# Patient Record
Sex: Female | Born: 1955 | Race: White | Hispanic: No | Smoking: Never smoker
Health system: Southern US, Community
[De-identification: ages and names within clinical notes are randomized; demographics above are authoritative.]

## PROBLEM LIST (undated history)

## (undated) DIAGNOSIS — E079 Disorder of thyroid, unspecified: Secondary | ICD-10-CM

## (undated) DIAGNOSIS — F419 Anxiety disorder, unspecified: Secondary | ICD-10-CM

## (undated) DIAGNOSIS — F329 Major depressive disorder, single episode, unspecified: Secondary | ICD-10-CM

## (undated) DIAGNOSIS — F32A Depression, unspecified: Secondary | ICD-10-CM

## (undated) DIAGNOSIS — T7840XA Allergy, unspecified, initial encounter: Secondary | ICD-10-CM

## (undated) HISTORY — PX: APPENDECTOMY: SHX54

## (undated) HISTORY — DX: Depression, unspecified: F32.A

## (undated) HISTORY — DX: Disorder of thyroid, unspecified: E07.9

## (undated) HISTORY — PX: CHOLECYSTECTOMY: SHX55

## (undated) HISTORY — DX: Allergy, unspecified, initial encounter: T78.40XA

## (undated) HISTORY — DX: Major depressive disorder, single episode, unspecified: F32.9

## (undated) HISTORY — PX: ABDOMINAL HYSTERECTOMY: SHX81

## (undated) HISTORY — DX: Anxiety disorder, unspecified: F41.9

---

## 2007-01-17 ENCOUNTER — Encounter: Admission: RE | Admit: 2007-01-17 | Discharge: 2007-01-17 | Payer: Self-pay | Admitting: Internal Medicine

## 2008-02-08 ENCOUNTER — Ambulatory Visit: Payer: Self-pay | Admitting: Gastroenterology

## 2008-02-22 ENCOUNTER — Ambulatory Visit: Payer: Self-pay | Admitting: Gastroenterology

## 2008-06-27 ENCOUNTER — Encounter: Admission: RE | Admit: 2008-06-27 | Discharge: 2008-06-27 | Payer: Self-pay | Admitting: Internal Medicine

## 2009-12-23 ENCOUNTER — Encounter: Admission: RE | Admit: 2009-12-23 | Discharge: 2009-12-23 | Payer: Self-pay | Admitting: Internal Medicine

## 2011-10-08 ENCOUNTER — Ambulatory Visit (INDEPENDENT_AMBULATORY_CARE_PROVIDER_SITE_OTHER): Payer: BC Managed Care – PPO

## 2011-10-08 DIAGNOSIS — R51 Headache: Secondary | ICD-10-CM

## 2011-10-08 DIAGNOSIS — F411 Generalized anxiety disorder: Secondary | ICD-10-CM

## 2011-10-08 DIAGNOSIS — E669 Obesity, unspecified: Secondary | ICD-10-CM

## 2011-10-08 DIAGNOSIS — R1013 Epigastric pain: Secondary | ICD-10-CM

## 2011-10-08 DIAGNOSIS — K3189 Other diseases of stomach and duodenum: Secondary | ICD-10-CM

## 2011-10-14 ENCOUNTER — Other Ambulatory Visit: Payer: Self-pay | Admitting: Physician Assistant

## 2011-10-14 DIAGNOSIS — Z1231 Encounter for screening mammogram for malignant neoplasm of breast: Secondary | ICD-10-CM

## 2011-10-26 ENCOUNTER — Ambulatory Visit
Admission: RE | Admit: 2011-10-26 | Discharge: 2011-10-26 | Disposition: A | Discharge status: Home / Self Care | Payer: Self-pay | Source: Ambulatory Visit | Attending: Physician Assistant | Admitting: Physician Assistant

## 2011-10-26 DIAGNOSIS — Z1231 Encounter for screening mammogram for malignant neoplasm of breast: Secondary | ICD-10-CM

## 2011-11-10 ENCOUNTER — Other Ambulatory Visit: Payer: Self-pay | Admitting: Physician Assistant

## 2011-11-10 MED ORDER — ESTROGENS CONJUGATED 0.3 MG PO TABS: ORAL_TABLET | ORAL | Status: DC

## 2011-12-29 ENCOUNTER — Other Ambulatory Visit: Payer: Self-pay | Admitting: Physician Assistant

## 2012-01-28 ENCOUNTER — Ambulatory Visit (INDEPENDENT_AMBULATORY_CARE_PROVIDER_SITE_OTHER): Payer: BC Managed Care – PPO | Admitting: Physician Assistant

## 2012-01-28 VITALS — BP 116/74 | HR 74 | Temp 98.3°F | Resp 16 | Ht 61.25 in | Wt 183.2 lb

## 2012-01-28 DIAGNOSIS — E034 Atrophy of thyroid (acquired): Secondary | ICD-10-CM | POA: Insufficient documentation

## 2012-01-28 DIAGNOSIS — F411 Generalized anxiety disorder: Secondary | ICD-10-CM

## 2012-01-28 DIAGNOSIS — R5381 Other malaise: Secondary | ICD-10-CM

## 2012-01-28 DIAGNOSIS — E039 Hypothyroidism, unspecified: Secondary | ICD-10-CM

## 2012-01-28 DIAGNOSIS — R7989 Other specified abnormal findings of blood chemistry: Secondary | ICD-10-CM

## 2012-01-28 LAB — POCT CBC
MCV: 82.7 fL (ref 80–97)
MPV: 8.9 fL (ref 0–99.8)
POC Granulocyte: 5.8 (ref 2–6.9)
POC LYMPH PERCENT: 27.2 %L (ref 10–50)
POC MID %: 5.1 %M (ref 0–12)
RBC: 5.08 M/uL (ref 4.04–5.48)
WBC: 8.5 10*3/uL (ref 4.6–10.2)

## 2012-01-28 MED ORDER — LIOTHYRONINE SODIUM 5 MCG PO TABS: 5.0000 ug | ORAL_TABLET | Freq: Every day | ORAL | Status: DC

## 2012-01-28 MED ORDER — BUPROPION HCL ER (SR) 150 MG PO TB12: 150.0000 mg | ORAL_TABLET | Freq: Two times a day (BID) | ORAL | Status: DC

## 2012-01-28 MED ORDER — LEVOTHYROXINE SODIUM 125 MCG PO TABS: 125.0000 ug | ORAL_TABLET | Freq: Every day | ORAL | Status: DC

## 2012-01-28 NOTE — Progress Notes (Signed)
  Subjective:    Patient ID: Lacey Wilson, female    DOB: Feb 11, 1956, 56 y.o.   MRN: [id number]  HPI Doing well.  Here bc feeling fatigued  And arms feeling "heavy"; that's how she felt when she was initially diagnosed as hypothyroid.   Also, needs to have LFTs rechecked bc were slightly elevated in January. Anxiety is well controlled on the Wellbutrin.  She is feeling motivated to exercise and has lost 17 pounds since January.  Review of Systems  All other systems reviewed and are negative.       Objective:   Physical Exam  Constitutional: She is oriented to person, place, and time. She appears well-developed and well-nourished.  HENT:  Head: Normocephalic and atraumatic.  Neck: No thyromegaly present.  Cardiovascular: Normal rate, regular rhythm and normal heart sounds.   Pulmonary/Chest: Effort normal and breath sounds normal.  Neurological: She is alert and oriented to person, place, and time.  Skin: Skin is warm and dry.      Assessment & Plan:  Hypothyroid-experiencing fatigue-check TSH Anxiety and obesity-both improving on wellbutrin H/o elevated LFTs in January 2013 during viral illness.

## 2012-01-29 LAB — HEPATIC FUNCTION PANEL
Alkaline Phosphatase: 57 U/L (ref 39–117)
Bilirubin, Direct: 0.1 mg/dL (ref 0.0–0.3)
Total Bilirubin: 0.3 mg/dL (ref 0.3–1.2)
Total Protein: 6.6 g/dL (ref 6.0–8.3)

## 2012-01-29 LAB — TSH: TSH: 2.315 u[IU]/mL (ref 0.350–4.500)

## 2012-03-28 DIAGNOSIS — Z0271 Encounter for disability determination: Secondary | ICD-10-CM

## 2012-04-20 ENCOUNTER — Ambulatory Visit (INDEPENDENT_AMBULATORY_CARE_PROVIDER_SITE_OTHER): Payer: BC Managed Care – PPO | Admitting: Physician Assistant

## 2012-04-20 ENCOUNTER — Encounter: Payer: Self-pay | Admitting: Physician Assistant

## 2012-04-20 VITALS — BP 122/80 | HR 80 | Temp 97.9°F | Resp 16 | Ht 61.25 in | Wt 166.6 lb

## 2012-04-20 DIAGNOSIS — F32A Depression, unspecified: Secondary | ICD-10-CM

## 2012-04-20 DIAGNOSIS — E669 Obesity, unspecified: Secondary | ICD-10-CM

## 2012-04-20 DIAGNOSIS — F3289 Other specified depressive episodes: Secondary | ICD-10-CM

## 2012-04-20 DIAGNOSIS — R51 Headache: Secondary | ICD-10-CM

## 2012-04-20 DIAGNOSIS — F329 Major depressive disorder, single episode, unspecified: Secondary | ICD-10-CM

## 2012-04-20 DIAGNOSIS — Z0000 Encounter for general adult medical examination without abnormal findings: Secondary | ICD-10-CM

## 2012-04-20 DIAGNOSIS — E039 Hypothyroidism, unspecified: Secondary | ICD-10-CM

## 2012-04-20 LAB — CBC WITH DIFFERENTIAL/PLATELET
Basophils Relative: 0 % (ref 0–1)
Hemoglobin: 14.8 g/dL (ref 12.0–15.0)
Lymphocytes Relative: 31 % (ref 12–46)
Lymphs Abs: 2 10*3/uL (ref 0.7–4.0)
Monocytes Absolute: 0.5 10*3/uL (ref 0.1–1.0)
Monocytes Relative: 7 % (ref 3–12)
Neutro Abs: 4.1 10*3/uL (ref 1.7–7.7)
Neutrophils Relative %: 61 % (ref 43–77)
Platelets: 297 10*3/uL (ref 150–400)
RBC: 5.5 MIL/uL — ABNORMAL HIGH (ref 3.87–5.11)
WBC: 6.7 10*3/uL (ref 4.0–10.5)

## 2012-04-20 LAB — LIPID PANEL
Cholesterol: 230 mg/dL — ABNORMAL HIGH (ref 0–200)
HDL: 65 mg/dL (ref >39)
LDL Cholesterol: 149 mg/dL — ABNORMAL HIGH (ref 0–99)
Triglycerides: 82 mg/dL (ref <150)
VLDL: 16 mg/dL (ref 0–40)

## 2012-04-20 LAB — COMPREHENSIVE METABOLIC PANEL
Albumin: 4.8 g/dL (ref 3.5–5.2)
BUN: 16 mg/dL (ref 6–23)
Calcium: 10.2 mg/dL (ref 8.4–10.5)
Potassium: 4.9 mEq/L (ref 3.5–5.3)
Sodium: 139 mEq/L (ref 135–145)
Total Protein: 7.2 g/dL (ref 6.0–8.3)

## 2012-04-20 LAB — POCT URINALYSIS DIPSTICK
Blood, UA: NEGATIVE
Ketones, UA: NEGATIVE
Leukocytes, UA: NEGATIVE
Spec Grav, UA: 1.02
Urobilinogen, UA: 0.2
pH, UA: 6.5

## 2012-04-20 LAB — TSH: TSH: 4.628 u[IU]/mL — ABNORMAL HIGH (ref 0.350–4.500)

## 2012-04-20 MED ORDER — ESTROGENS CONJUGATED 0.3 MG PO TABS: ORAL_TABLET | ORAL | Status: DC

## 2012-04-20 MED ORDER — BUPROPION HCL ER (SR) 150 MG PO TB12: 150.0000 mg | ORAL_TABLET | Freq: Two times a day (BID) | ORAL | Status: DC

## 2012-04-20 MED ORDER — FLUTICASONE PROPIONATE 50 MCG/ACT NA SUSP: 2.0000 | Freq: Every day | NASAL | Status: DC

## 2012-04-20 MED ORDER — LIOTHYRONINE SODIUM 5 MCG PO TABS: 5.0000 ug | ORAL_TABLET | Freq: Every day | ORAL | Status: DC

## 2012-04-20 MED ORDER — LEVOTHYROXINE SODIUM 125 MCG PO TABS: 125.0000 ug | ORAL_TABLET | Freq: Every day | ORAL | Status: DC

## 2012-04-20 NOTE — Progress Notes (Signed)
  Subjective:    Patient ID: Lacey Wilson, female    DOB: December 25, 1955, 56 y.o.   MRN: [id number]  HPI Here for CPE. See scanned in sheet.  Occasional dizziness and nasal congestion and itching with allergies. She is building a house in [place] and will be moving after the start of 2014.  UTD on MMG, colonoscopy.  Review of Systems  All other systems reviewed and are negative.       Objective:   Physical Exam  Nursing note and vitals reviewed. Constitutional: She is oriented to person, place, and time. She appears well-developed and well-nourished.  HENT:  Head: Normocephalic and atraumatic.  Mouth/Throat: Oropharynx is clear and moist. No oropharyngeal exudate.       Scarring B TM. Turbinates enlarged B.  Eyes: Conjunctivae and EOM are normal. Pupils are equal, round, and reactive to light.       Fundi benign  Neck: Normal range of motion. Neck supple.  Cardiovascular: Normal rate, regular rhythm, normal heart sounds and intact distal pulses.  Exam reveals no gallop and no friction rub.   No murmur heard. Pulmonary/Chest: Effort normal and breath sounds normal. No respiratory distress. She has no wheezes. She has no rales. She exhibits no tenderness. Right breast exhibits no inverted nipple, no mass, no nipple discharge, no skin change and no tenderness. Left breast exhibits no inverted nipple, no mass, no nipple discharge, no skin change and no tenderness. Breasts are symmetrical.  Abdominal: Soft. Bowel sounds are normal.  Musculoskeletal: Normal range of motion.  Neurological: She is alert and oriented to person, place, and time. She has normal reflexes. No cranial nerve deficit.       Finger to nose, heel to shin intact.  Neg rhomberg  Skin: Skin is warm and dry.  Psychiatric: She has a normal mood and affect. Her behavior is normal. Thought content normal.          Assessment & Plan:  CPE-normal exam Dizziness-eustachian tube dysfunction(no neurological  findings)-restart flonase. Hypothyroidism-checking labs Depression-continue wellbutrin. Yay on weight loss, dietary changes, and exercise!!  Keep up the good work.

## 2012-04-22 ENCOUNTER — Other Ambulatory Visit: Payer: Self-pay | Admitting: Physician Assistant

## 2012-04-22 MED ORDER — LEVOTHYROXINE SODIUM 137 MCG PO TABS: 125.0000 ug | ORAL_TABLET | Freq: Every day | ORAL | Status: DC

## 2012-06-09 ENCOUNTER — Ambulatory Visit (INDEPENDENT_AMBULATORY_CARE_PROVIDER_SITE_OTHER): Payer: BC Managed Care – PPO | Admitting: Physician Assistant

## 2012-06-09 VITALS — BP 109/72 | HR 76 | Temp 98.2°F | Resp 16 | Ht 61.25 in | Wt 163.0 lb

## 2012-06-09 DIAGNOSIS — N399 Disorder of urinary system, unspecified: Secondary | ICD-10-CM

## 2012-06-09 DIAGNOSIS — E039 Hypothyroidism, unspecified: Secondary | ICD-10-CM

## 2012-06-09 LAB — POCT URINALYSIS DIPSTICK
Blood, UA: NEGATIVE
Protein, UA: NEGATIVE
Urobilinogen, UA: 0.2
pH, UA: 6

## 2012-06-09 LAB — POCT UA - MICROSCOPIC ONLY
Bacteria, U Microscopic: NEGATIVE
Casts, Ur, LPF, POC: NEGATIVE
Crystals, Ur, HPF, POC: NEGATIVE
Epithelial cells, urine per micros: NEGATIVE
Mucus, UA: NEGATIVE
RBC, urine, microscopic: NEGATIVE

## 2012-06-09 LAB — THYROID PANEL WITH TSH: T4, Total: 14.1 ug/dL — ABNORMAL HIGH (ref 5.0–12.5)

## 2012-06-09 NOTE — Progress Notes (Signed)
  Subjective:    Patient ID: Lacey Wilson, female    DOB: 1956-05-20, 56 y.o.   MRN: [id number]  HPI 56 yr old female presents with twinges in her bladder on and off for about 2 weeks now.  She had travelled to the beach about 2-3 weeks ago. Denies dysuria or frequency.  She drank a lot of water and cranberry juice which seemed to help some. She denies any f/c, or abdominal pain.  It is also almost time for rechecking her thyroid after increasing her dose of synthroid from 125 to 137 mcg about 6 1/2 weeks ago.  Review of Systems  All other systems reviewed and are negative.       Objective:   Physical Exam  Nursing note and vitals reviewed. Constitutional: She is oriented to person, place, and time. She appears well-developed and well-nourished.  Cardiovascular: Normal rate, regular rhythm and normal heart sounds.   Pulmonary/Chest: Effort normal and breath sounds normal.  Abdominal:       No flank pain  Neurological: She is alert and oriented to person, place, and time.  Skin: Skin is warm and dry.   Results for orders placed in visit on 06/09/12  POCT URINALYSIS DIPSTICK      Component Value Range   Color, UA yellow     Clarity, UA clear     Glucose, UA neg     Bilirubin, UA neg     Ketones, UA neg     Spec Grav, UA 1.010     Blood, UA neg     pH, UA 6.0     Protein, UA neg     Urobilinogen, UA 0.2     Nitrite, UA neg     Leukocytes, UA Negative    POCT UA - MICROSCOPIC ONLY      Component Value Range   WBC, Ur, HPF, POC neg     RBC, urine, microscopic neg     Bacteria, U Microscopic neg     Mucus, UA neg     Epithelial cells, urine per micros neg     Crystals, Ur, HPF, POC neg     Casts, Ur, LPF, POC neg     Yeast, UA neg          Assessment & Plan:  ?bladder spasms-normal urine-we will just observe and see if this continues.  Hypothyroidism-check panel

## 2012-10-21 ENCOUNTER — Other Ambulatory Visit: Payer: Self-pay | Admitting: Physician Assistant

## 2012-11-03 ENCOUNTER — Other Ambulatory Visit: Payer: Self-pay | Admitting: Physician Assistant

## 2012-11-11 ENCOUNTER — Ambulatory Visit (INDEPENDENT_AMBULATORY_CARE_PROVIDER_SITE_OTHER): Payer: BC Managed Care – PPO | Admitting: Physician Assistant

## 2012-11-11 VITALS — BP 124/69 | HR 77 | Temp 98.0°F | Resp 16 | Ht 61.25 in | Wt 161.0 lb

## 2012-11-11 DIAGNOSIS — E039 Hypothyroidism, unspecified: Secondary | ICD-10-CM

## 2012-11-11 DIAGNOSIS — F4323 Adjustment disorder with mixed anxiety and depressed mood: Secondary | ICD-10-CM

## 2012-11-11 DIAGNOSIS — R5381 Other malaise: Secondary | ICD-10-CM

## 2012-11-11 LAB — POCT CBC
HCT, POC: 45.4 % (ref 37.7–47.9)
Hemoglobin: 14.8 g/dL (ref 12.2–16.2)
MCV: 84.6 fL (ref 80–97)
MID (cbc): 0.4 (ref 0–0.9)
MPV: 8.5 fL (ref 0–99.8)
POC Granulocyte: 4.6 (ref 2–6.9)
POC MID %: 5.8 %M (ref 0–12)

## 2012-11-11 LAB — THYROID PANEL WITH TSH
Free Thyroxine Index: 3.8 (ref 1.0–3.9)
T4, Total: 14.8 ug/dL — ABNORMAL HIGH (ref 5.0–12.5)

## 2012-11-11 MED ORDER — CLONAZEPAM 0.5 MG PO TABS: ORAL_TABLET | ORAL | Status: DC

## 2012-11-11 MED ORDER — LEVOTHYROXINE SODIUM 137 MCG PO TABS: ORAL_TABLET | ORAL | Status: DC

## 2012-11-11 MED ORDER — BUPROPION HCL ER (SR) 150 MG PO TB12: 150.0000 mg | ORAL_TABLET | Freq: Two times a day (BID) | ORAL | Status: DC

## 2012-11-11 MED ORDER — LIOTHYRONINE SODIUM 5 MCG PO TABS: 5.0000 ug | ORAL_TABLET | Freq: Every day | ORAL | Status: DC

## 2012-11-11 NOTE — Progress Notes (Signed)
Subjective:    Patient ID: Lacey Wilson, female    DOB: Apr 19, 1956, 57 y.o.   MRN: [id number]  HPI Rhetta is a pleasant 57 yr old CF that presents today with increased anxiety.  She has been "crying for no reason" for the last 3 weeks or so on and off.  She has also been feeling more tired than normal. After further discussion, there are multiple stressors in her life.  Her father that has alzheimers lives in [place], [place] with her mother who provides continuous care for him.  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  She is out of the hospital now, but she isn't doing well and only weighs about 70 pounds.  She had planned to move to [place], [place], but now that is on hold.  However, she had already built and owns a home there.  She currently has it on the market.  Her son stays with her some which is helpful, but it also takes away any privacy or "down-time" she might have. She is often still awake at midnight and has to get up at 5:30am.  She doesn't sleep well.  She eats fairly healthy and continues to walk on her treadmill.  She is still taking her Wellbutrin.  Review of Systems  All other systems reviewed and are negative.       Objective:   Physical Exam  Nursing note and vitals reviewed. Constitutional: She is oriented to person, place, and time. She appears well-developed and well-nourished.  Tearful at times  HENT:  Head: Normocephalic and atraumatic.  Neck: Normal range of motion. Neck supple. No thyromegaly present.  Cardiovascular: Normal rate, regular rhythm and normal heart sounds.   Pulmonary/Chest: Effort normal and breath sounds normal.  Lymphadenopathy:    She has no cervical adenopathy.  Neurological: She is alert and oriented to person, place, and time.  Skin: Skin is warm and dry.  Psychiatric: She has a normal mood and affect. Her behavior is normal.   Results for orders placed in visit on 11/11/12  POCT CBC   Result Value Range   WBC 7.5  4.6 - 10.2 K/uL   Lymph, poc 2.4  0.6 - 3.4   POC LYMPH PERCENT 32.4  10 - 50 %L   MID (cbc) 0.4  0 - 0.9   POC MID % 5.8  0 - 12 %M   POC Granulocyte 4.6  2 - 6.9   Granulocyte percent 61.8  37 - 80 %G   RBC 5.37  4.04 - 5.48 M/uL   Hemoglobin 14.8  12.2 - 16.2 g/dL   HCT, POC 14.7  82.9 - 47.9 %   MCV 84.6  80 - 97 fL   MCH, POC 27.6  27 - 31.2 pg   MCHC 32.6  31.8 - 35.4 g/dL   RDW, POC 56.2     Platelet Count, POC 312  142 - 424 K/uL   MPV 8.5  0 - 99.8 fL      Assessment & Plan:  Situational anxiety/depression-she can try clonazepam for short-term use to see if it will help her get some sleep and to level out her anxiety when she feels overwhelmed. I will check her thyroid today to see if we need to make any adjustment there.  I will also check her Vit. D.  Here CBC is normal today.  If all of those are normal and she continues to struggle,  I may try adding elavil to help sleep and reduce her anxiety instead of the clonazepam. counseled face to face 40 minutes.  Meds ordered this encounter  Medications  . buPROPion (WELLBUTRIN SR) 150 MG 12 hr tablet    Sig: Take 1 tablet (150 mg total) by mouth 2 (two) times daily.    Dispense:  180 tablet    Refill:  3    Order Specific Question:  Supervising Provider    Answer:  DOOLITTLE, ROBERT P [[id number]]  . levothyroxine (SYNTHROID) 137 MCG tablet    Sig: TAKE ONE TABLET BY MOUTH DAILY    Dispense:  90 tablet    Refill:  2    Order Specific Question:  Supervising Provider    Answer:  DOOLITTLE, ROBERT P [[id number]]  . liothyronine (CYTOMEL) 5 MCG tablet    Sig: Take 1 tablet (5 mcg total) by mouth daily.    Dispense:  90 tablet    Refill:  3    Order Specific Question:  Supervising Provider    Answer:  DOOLITTLE, ROBERT P [[id number]]  . clonazePAM (KLONOPIN) 0.5 MG tablet    Sig: 1/2-1 tablet up to 3x daily prn anxiety    Dispense:  40 tablet    Refill:  0    Order Specific Question:  Supervising Provider     Answer:  DOOLITTLE, ROBERT P [[id number]]

## 2013-02-22 ENCOUNTER — Encounter: Payer: Self-pay | Admitting: Physician Assistant

## 2013-02-22 ENCOUNTER — Ambulatory Visit (INDEPENDENT_AMBULATORY_CARE_PROVIDER_SITE_OTHER): Payer: BC Managed Care – PPO | Admitting: Physician Assistant

## 2013-02-22 VITALS — BP 118/78 | HR 63 | Temp 97.4°F | Resp 16 | Ht 61.25 in | Wt 174.0 lb

## 2013-02-22 DIAGNOSIS — Z113 Encounter for screening for infections with a predominantly sexual mode of transmission: Secondary | ICD-10-CM

## 2013-02-22 DIAGNOSIS — E8941 Symptomatic postprocedural ovarian failure: Secondary | ICD-10-CM

## 2013-02-22 DIAGNOSIS — Z9109 Other allergy status, other than to drugs and biological substances: Secondary | ICD-10-CM

## 2013-02-22 DIAGNOSIS — E8940 Asymptomatic postprocedural ovarian failure: Secondary | ICD-10-CM

## 2013-02-22 DIAGNOSIS — Z0000 Encounter for general adult medical examination without abnormal findings: Secondary | ICD-10-CM

## 2013-02-22 LAB — POCT URINALYSIS DIPSTICK
Leukocytes, UA: NEGATIVE
Protein, UA: NEGATIVE
Urobilinogen, UA: 0.2
pH, UA: 7

## 2013-02-22 LAB — CBC
Hemoglobin: 13.7 g/dL (ref 12.0–15.0)
MCH: 27 pg (ref 26.0–34.0)
MCHC: 34.3 g/dL (ref 30.0–36.0)
MCV: 78.7 fL (ref 78.0–100.0)
RBC: 5.07 MIL/uL (ref 3.87–5.11)
WBC: 7.4 10*3/uL (ref 4.0–10.5)

## 2013-02-22 MED ORDER — FLUTICASONE PROPIONATE 50 MCG/ACT NA SUSP: 2.0000 | Freq: Every day | NASAL | Status: DC

## 2013-02-22 MED ORDER — ESTROGENS CONJUGATED 0.3 MG PO TABS: ORAL_TABLET | ORAL | Status: DC

## 2013-02-22 NOTE — Progress Notes (Signed)
  Subjective:    Patient ID: Lacey Wilson, female    DOB: 07-08-1956, 57 y.o.   MRN: [id number]  HPI    Review of Systems  Constitutional: Positive for diaphoresis and unexpected weight change.  HENT: Positive for neck pain and neck stiffness.        Objective:   Physical Exam        Assessment & Plan:

## 2013-02-23 LAB — LIPID PANEL
Cholesterol: 247 mg/dL — ABNORMAL HIGH (ref 0–200)
HDL: 78 mg/dL (ref >39)
LDL Cholesterol: 151 mg/dL — ABNORMAL HIGH (ref 0–99)
Total CHOL/HDL Ratio: 3.2 Ratio

## 2013-02-23 LAB — HEPATITIS C ANTIBODY: HCV Ab: NEGATIVE

## 2013-02-23 LAB — COMPREHENSIVE METABOLIC PANEL
ALT: 31 U/L (ref 0–35)
CO2: 28 mEq/L (ref 19–32)
Calcium: 9.8 mg/dL (ref 8.4–10.5)
Total Protein: 6.8 g/dL (ref 6.0–8.3)

## 2013-02-23 LAB — RPR

## 2013-02-23 LAB — HEPATITIS B SURFACE ANTIGEN: Hepatitis B Surface Ag: NEGATIVE

## 2013-02-24 NOTE — Progress Notes (Signed)
   [address] [place] [address]   Phone [phone]  Subjective:    Patient ID: Lacey Wilson, female    DOB: 1956/03/05, 57 y.o.   MRN: [id number]  HPI  Pt presents to clinic for her CPE.  She is not having any problems - she needs her meds refilled.  She plans on getting her mammogram over the summer.  She thinks it is time also her her repeat 5Y colonoscopy due to polyp removal.  She would like STD screening because she did have unprotected sex with a new partner about 6 months ago and though she is not having any symptoms she wants to make sure.  Review of Systems  Constitutional: Negative.   HENT: Negative.   Eyes: Negative.   Respiratory: Negative.   Cardiovascular: Negative.   Gastrointestinal: Negative.   Endocrine: Negative.   Genitourinary: Negative.   Musculoskeletal: Negative.   Skin: Negative.   Allergic/Immunologic: Negative.   Neurological: Negative.   Hematological: Negative.   Psychiatric/Behavioral: Negative.        Objective:   Physical Exam  Vitals reviewed. Constitutional: She is oriented to person, place, and time. She appears well-developed and well-nourished.  HENT:  Head: Normocephalic and atraumatic.  Right Ear: Hearing, tympanic membrane, external ear and ear canal normal.  Left Ear: Hearing, tympanic membrane, external ear and ear canal normal.  Nose: Nose normal.  Mouth/Throat: Uvula is midline and oropharynx is clear and moist.  Eyes: Conjunctivae and EOM are normal. Pupils are equal, round, and reactive to light.  Gets eyes checked yearly.  Wears reading glasses.  Neck: Normal range of motion. Neck supple. No thyromegaly present.  Cardiovascular: Normal rate, regular rhythm and normal heart sounds.   No murmur heard. Pulmonary/Chest: Effort normal and breath sounds normal.  Abdominal: Soft. Bowel sounds are normal.  Genitourinary: No breast swelling, tenderness, discharge or bleeding.  deferred exam due to hyst many years and normal  pap since.  Musculoskeletal: Normal range of motion.  Lymphadenopathy:    She has no cervical adenopathy.  Neurological: She is alert and oriented to person, place, and time. She has normal reflexes.  Skin: Skin is warm and dry.  Psychiatric: She has a normal mood and affect. Her behavior is normal. Judgment and thought content normal.          Assessment & Plan:  Environmental allergies - Plan: fluticasone (FLONASE) 50 MCG/ACT nasal spray  Surgical menopause - Plan: estrogens, conjugated, (PREMARIN) 0.3 MG tablet - d/w pt the risk of continuing on this medication she wants to continue for now and she is on the lowest dose.  Annual physical exam - Plan: CBC, Comprehensive metabolic panel, Lipid panel, POCT urinalysis dipstick, Hepatitis B surface antigen, Hepatitis C antibody, HIV antibody, RPR, IFOBT POC (occult bld, rslt in office)  Screen for STD (sexually transmitted disease) - Plan: Hepatitis B surface antigen, Hepatitis C antibody, HIV antibody, RPR  Benny Lennert PA-C 02/24/2013 1:28 PM

## 2013-03-03 ENCOUNTER — Telehealth: Payer: Self-pay

## 2013-03-03 DIAGNOSIS — E039 Hypothyroidism, unspecified: Secondary | ICD-10-CM

## 2013-03-03 NOTE — Telephone Encounter (Signed)
I see where pt had Thyroid Panel with TSH performed in Feb/2014. With Angela's note stating to follow up in 6 months. Pt had recent lab work done, but no thyroid tests were performed on this OV. What would you advise [place] to do?

## 2013-03-03 NOTE — Telephone Encounter (Signed)
Patient was seen on May 28,2014 and received her lab results online. During her ov, it was discussed that she would have a thyroid test but those results were not online. Please call back at [phone]. She really needed that thyroid test done.

## 2013-03-06 NOTE — Telephone Encounter (Signed)
Spoke with pt and explained that we would like for her to come back in for redraw since no thyroid tests were ordered. I apologized for the error and inconvenience. She was very nice and will come in later this week for redraw.

## 2013-03-06 NOTE — Telephone Encounter (Signed)
I think that the blood has been thrown away at [facility]. Please apologize to the patient and have her come in and have her blood draw - no copay is necessary.  I will order it in future orders.

## 2013-03-09 ENCOUNTER — Other Ambulatory Visit (INDEPENDENT_AMBULATORY_CARE_PROVIDER_SITE_OTHER): Payer: BC Managed Care – PPO | Admitting: Family Medicine

## 2013-03-09 DIAGNOSIS — E039 Hypothyroidism, unspecified: Secondary | ICD-10-CM

## 2013-03-09 LAB — TSH: TSH: 1.149 u[IU]/mL (ref 0.350–4.500)

## 2013-03-09 NOTE — Progress Notes (Signed)
Patient here today for labs only. °

## 2013-08-03 ENCOUNTER — Other Ambulatory Visit: Payer: Self-pay

## 2013-08-11 ENCOUNTER — Ambulatory Visit (INDEPENDENT_AMBULATORY_CARE_PROVIDER_SITE_OTHER): Payer: BC Managed Care – PPO | Admitting: Physician Assistant

## 2013-08-11 VITALS — BP 124/86 | HR 70 | Temp 98.4°F | Resp 16 | Ht 61.0 in | Wt 179.2 lb

## 2013-08-11 DIAGNOSIS — G4700 Insomnia, unspecified: Secondary | ICD-10-CM

## 2013-08-11 DIAGNOSIS — R5383 Other fatigue: Secondary | ICD-10-CM

## 2013-08-11 DIAGNOSIS — R5381 Other malaise: Secondary | ICD-10-CM

## 2013-08-11 LAB — POCT CBC
Granulocyte percent: 59.5 %G (ref 37–80)
MCV: 86.5 fL (ref 80–97)
MID (cbc): 0.3 (ref 0–0.9)
Platelet Count, POC: 268 10*3/uL (ref 142–424)
RBC: 5.05 M/uL (ref 4.04–5.48)

## 2013-08-11 MED ORDER — BUPROPION HCL ER (XL) 150 MG PO TB24: 150.0000 mg | ORAL_TABLET | Freq: Every day | ORAL | Status: DC

## 2013-08-11 MED ORDER — TRAZODONE HCL 100 MG PO TABS: 100.0000 mg | ORAL_TABLET | Freq: Every day | ORAL | Status: DC

## 2013-08-11 NOTE — Patient Instructions (Signed)
Begin taking the Wellbutrin extended release just once daily in the morning.  Take the trazodone at bedtime - there is room to increase the dosage on this if you are getting some benefit.    Continue maintaining good sleep hygiene practices.    Let me know how your symptoms are doing in the next 1-2 weeks.    We can consider a sleep study in the future to rule out sleep apnea  I will let you know when your labs are back and if we need to do anything differently based on those

## 2013-08-11 NOTE — Progress Notes (Signed)
Subjective:    Patient ID: Lacey Wilson, female    DOB: 26-Dec-1955, 57 y.o.   MRN: [id number]  HPI   Ms. Lacey Wilson is a very pleasant 57 yr old female, previously a patient of [employer].  She is here with concern for fatigue and crying spells.  Reports that for the last month or so she has been quite fatigued "no matter how much I sleep."  Reports that she falls asleep easily but has difficulty staying asleep.  Tosses and turns a lot.  Estimates that she gets maybe 4-5 good hours of sleep per night.  She keeps the same bed time throughout the week, occ slightly later on weekends.  Wakes up at the same time every day.  Denies watching television, reading, cell phone use in bed.  Unsure if she snores as she sleeps alone.  Son lives with her but is in a different part of the house.  Has had some insomnia in the past.  Was given klonopin by Marylene Land - has used maybe 1 of these.  It does make her sleepy but doesn't want to use consistently.  She was additionally having some anxiety at that time which she is no longer having.  Also feels somewhat sad and down, but not sure why.  "I shouldn't feel like this."  Has an upcoming cruise in 3 wks and is looking forward to this.  Denies anhedonia.  Able to function daily.  Not wanting to stay in bed all day.  Currently takes wellbutrin 150mg  BID - has been on for awhile now. Takes second dose at bedtime. Previously saw good relief of symptoms but not sure if medicine is as effective now.  Was on effexor about 10 yrs ago after her divorce.   Reports she has gained about 15 lbs in the last 3 months.  Cannot lose this weight no matter how hard she tries.  Exercises about 3-4 times per week - cardio and strength.  Reports diet is good, tries to eat healthy.  No sugar sweetened beverages.    History of hypothyroidism - on levothyroxine and liothyronine for many years.     Review of Systems  Constitutional: Positive for fatigue and unexpected weight change (15lb  wt gain).  HENT: Negative.   Respiratory: Negative.   Cardiovascular: Negative.   Gastrointestinal: Negative.   Musculoskeletal: Positive for arthralgias (hip pain).  Skin: Negative.   Neurological: Negative.   Psychiatric/Behavioral: Positive for sleep disturbance and dysphoric mood.       Objective:   Physical Exam  Vitals reviewed. Constitutional: She is oriented to person, place, and time. She appears well-developed and well-nourished. No distress.  HENT:  Head: Normocephalic and atraumatic.  Eyes: Conjunctivae are normal. No scleral icterus.  Cardiovascular: Normal rate, regular rhythm and normal heart sounds.   Pulmonary/Chest: Effort normal and breath sounds normal. She has no wheezes. She has no rales.  Abdominal: Soft. There is no tenderness.  Neurological: She is alert and oriented to person, place, and time.  Skin: Skin is warm and dry.  Psychiatric: She has a normal mood and affect. Her behavior is normal. Judgment and thought content normal.    Results for orders placed in visit on 08/11/13  TSH      Result Value Range   TSH 1.080  0.350 - 4.500 uIU/mL  VITAMIN D 25 HYDROXY      Result Value Range   Vit D, 25-Hydroxy 40  30 - 89 ng/mL  T3, FREE  Result Value Range   T3, Free 2.9  2.3 - 4.2 pg/mL  T4, FREE      Result Value Range   Free T4 1.46  0.80 - 1.80 ng/dL  POCT CBC      Result Value Range   WBC 6.5  4.6 - 10.2 K/uL   Lymph, poc 2.3  0.6 - 3.4   POC LYMPH PERCENT 35.4  10 - 50 %L   MID (cbc) 0.3  0 - 0.9   POC MID % 5.1  0 - 12 %M   POC Granulocyte 3.9  2 - 6.9   Granulocyte percent 59.5  37 - 80 %G   RBC 5.05  4.04 - 5.48 M/uL   Hemoglobin 13.6  12.2 - 16.2 g/dL   HCT, POC 16.1  09.6 - 47.9 %   MCV 86.5  80 - 97 fL   MCH, POC 26.9 (*) 27 - 31.2 pg   MCHC 31.1 (*) 31.8 - 35.4 g/dL   RDW, POC 04.5     Platelet Count, POC 268  142 - 424 K/uL   MPV 8.7  0 - 99.8 fL       Assessment & Plan:  Fatigue - Plan: POCT CBC, TSH, Vit D  25  hydroxy (rtn osteoporosis monitoring), T3, Free, T4, Free  Insomnia - Plan: traZODone (DESYREL) 100 MG tablet   Ms. Lacey Wilson is a very pleasant 57 yr old female here with concern for about 1 month of fatigue and daytime sleepiness.  Reports good sleep hygiene habits.  Falls asleep easily but wakes frequently.  She currently takes wellbutrin sr 150mg  BID and has been taking the second dose at bedtime.  Pt has tolerated this dosing for some time now, but I question whether the bedtime dose is contributing to insomnia.  We discussed moving the second earlier or trying wellbutrin xl once daily formulation.  Pt would like to try the xl.  Will also add trazodone qhs.  Pt with PHQ9 score of 7 today indicating mild depression.  Will try this combination of medications and monitor symptoms closely.    Pt with long hx hypothyroidism but TSH, T3, T4 all normal today, so likely not contributing to symptoms.  CBC normal.  Vit D also WNL.  Will monitor symptoms and adjust therapy/pursue further work up as necessary.  Discussed the possibility of sleep study in the future to assess for OSA.  Meds ordered this encounter  Medications  . buPROPion (WELLBUTRIN XL) 150 MG 24 hr tablet    Sig: Take 1 tablet (150 mg total) by mouth daily.    Dispense:  90 tablet    Refill:  1    Order Specific Question:  Supervising Provider    Answer:  Ethelda Chick [[id number]]  . traZODone (DESYREL) 100 MG tablet    Sig: Take 1 tablet (100 mg total) by mouth at bedtime.    Dispense:  90 tablet    Refill:  1    Order Specific Question:  Supervising Provider    Answer:  Ethelda Chick [[id number]]    Loleta Dicker MHS, PA-C [facility] 11/15/201412:54 PM

## 2013-08-12 LAB — T3, FREE: T3, Free: 2.9 pg/mL (ref 2.3–4.2)

## 2013-08-12 LAB — VITAMIN D 25 HYDROXY (VIT D DEFICIENCY, FRACTURES): Vit D, 25-Hydroxy: 40 ng/mL (ref 30–89)

## 2013-09-30 ENCOUNTER — Ambulatory Visit (INDEPENDENT_AMBULATORY_CARE_PROVIDER_SITE_OTHER): Payer: BC Managed Care – PPO | Admitting: Family Medicine

## 2013-09-30 VITALS — BP 130/80 | HR 61 | Temp 98.1°F | Resp 16 | Ht 61.0 in | Wt 183.0 lb

## 2013-09-30 DIAGNOSIS — R51 Headache: Secondary | ICD-10-CM

## 2013-09-30 DIAGNOSIS — J329 Chronic sinusitis, unspecified: Secondary | ICD-10-CM

## 2013-09-30 MED ORDER — CEFDINIR 300 MG PO CAPS: 600.0000 mg | ORAL_CAPSULE | Freq: Every day | ORAL | Status: DC

## 2013-09-30 MED ORDER — PREDNISONE 20 MG PO TABS: ORAL_TABLET | ORAL | Status: DC

## 2013-09-30 NOTE — Progress Notes (Signed)
Subjective: 58 year old lady who's had a upper respiratory infection at Thanksgiving. Ever since then she's had waxing and waning of sinusitis septal. She's taken OTC medications with some relief, but it keeps coming back. She has facial pain and drainage. She has some cough but that's not bad. She has not been running a fever. She is just tired of being congested and painful  Objective: Pleasant lady in no major distress. TMs normal. Sinuses are tender, especially in the left maxillary and frontal area. Throat was erythematous on the right side associated postnasal drainage, even though her symptoms are worse in the left. Her neck was supple without significant nodes. Chest is clear to auscultation. Heart regular without murmurs gallops or arrhythmias.  Assessment: Acute and chronic sinusitis Status post URI  Plan: Amoxicillin caused itching. Despite that I went ahead and gave her Truman HaywardOmnicef which has a slight chance of cross allergenicity. Return if not improving. No studies done today.

## 2013-10-24 ENCOUNTER — Other Ambulatory Visit: Payer: Self-pay | Admitting: Physician Assistant

## 2014-02-05 ENCOUNTER — Other Ambulatory Visit: Payer: Self-pay | Admitting: Physician Assistant

## 2014-03-08 ENCOUNTER — Other Ambulatory Visit: Payer: Self-pay | Admitting: Physician Assistant

## 2014-03-12 ENCOUNTER — Other Ambulatory Visit: Payer: Self-pay | Admitting: Physician Assistant

## 2014-04-10 ENCOUNTER — Ambulatory Visit (INDEPENDENT_AMBULATORY_CARE_PROVIDER_SITE_OTHER): Payer: BC Managed Care – PPO | Admitting: Physician Assistant

## 2014-04-10 VITALS — BP 114/72 | HR 59 | Temp 98.0°F | Ht 61.0 in | Wt 188.2 lb

## 2014-04-10 DIAGNOSIS — M7711 Lateral epicondylitis, right elbow: Secondary | ICD-10-CM

## 2014-04-10 DIAGNOSIS — M7710 Lateral epicondylitis, unspecified elbow: Secondary | ICD-10-CM

## 2014-04-10 DIAGNOSIS — F329 Major depressive disorder, single episode, unspecified: Secondary | ICD-10-CM

## 2014-04-10 DIAGNOSIS — F32A Depression, unspecified: Secondary | ICD-10-CM

## 2014-04-10 DIAGNOSIS — F3289 Other specified depressive episodes: Secondary | ICD-10-CM

## 2014-04-10 MED ORDER — TENNIS ELBOW STRAP/AIR PAD MISC: 1.0000 [IU] | Freq: Every day | Status: DC

## 2014-04-10 MED ORDER — BUPROPION HCL ER (XL) 300 MG PO TB24: 300.0000 mg | ORAL_TABLET | Freq: Every day | ORAL | Status: DC

## 2014-04-10 MED ORDER — DICLOFENAC SODIUM 75 MG PO TBEC
75.0000 mg | DELAYED_RELEASE_TABLET | Freq: Two times a day (BID) | ORAL | Status: AC
Start: 2014-04-10 — End: ?

## 2014-04-10 NOTE — Patient Instructions (Signed)
Let me know in about a month how you are doing with your mood and elbow.

## 2014-04-10 NOTE — Progress Notes (Signed)
   Subjective:    Patient ID: Lacey Wilson, female    DOB: Aug 18, 1956, 58 y.o.   MRN: [id number]  HPI Pt presents to clinic with need for her Wellbutrin refill.  She feels like the Wellbutrin has helped with her depression but she over the last month has felt more sadness and has been crying more than normal.  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 She has minimal anxiety and rarely uses the Klonopin.  She works out regularly and enjoys doing yard work and uses that as a stress reliever.  She has also for the last month been having pain in her right elbow esp with use.  She has noticed that picking up things makes it worse.  She has had NKI.  She has taken no meds but did get an elbow sleeve but it does not seem to be helping. She is right handed.  Review of Systems  Musculoskeletal: Negative for joint swelling.  Psychiatric/Behavioral: Positive for dysphoric mood. The patient is not nervous/anxious.        Objective:   Physical Exam  Vitals reviewed. Constitutional: She is oriented to person, place, and time. She appears well-developed and well-nourished.  HENT:  Head: Normocephalic and atraumatic.  Eyes: Conjunctivae are normal.  Neck: Normal range of motion.  Pulmonary/Chest: Effort normal.  Musculoskeletal:       Right elbow: She exhibits normal range of motion and no swelling. Tenderness found. Lateral epicondyle tenderness noted. No medial epicondyle and no olecranon process tenderness noted.       Left elbow: Normal.  Pain at lateral epicondyle with wrist flexion that increases with resistance and increase pain with resistance with supination.  Pain is improved with fulcrum change with pressure just distal to lateral epicondyle.  Neurological: She is alert and oriented to person, place, and time.  Psychiatric: She has a normal mood and  affect. Her behavior is normal. Judgment and thought content normal.       Assessment & Plan:  Right lateral epicondylitis - Ice massage and rest.  If no better in 2 weeks she will RTC for xray and change in plan.  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  I think that she is not just having a stress reaction and her depression can be improved with increase in her medication.  She will contact me in about a month with her results.  Plan: buPROPion (WELLBUTRIN XL) 300 MG 24 hr tablet  Benny LennertSarah Zachari Alberta PA-C  [facility] 04/10/2014 7:31 PM

## 2014-05-18 ENCOUNTER — Encounter: Payer: BC Managed Care – PPO | Admitting: Family Medicine

## 2014-06-06 ENCOUNTER — Other Ambulatory Visit: Payer: Self-pay | Admitting: Physician Assistant

## 2014-06-07 NOTE — Telephone Encounter (Signed)
Called pt bc she was to have called Sarah after 1 mos. Pt reported that she completely forgot to send Maralyn Sago a message but she is doing so much better w/ medication change. I will send in RFs for pt but wanted you to know status, Maralyn Sago.

## 2014-06-29 ENCOUNTER — Ambulatory Visit (INDEPENDENT_AMBULATORY_CARE_PROVIDER_SITE_OTHER): Payer: BC Managed Care – PPO | Admitting: Family Medicine

## 2014-06-29 ENCOUNTER — Encounter: Payer: Self-pay | Admitting: Family Medicine

## 2014-06-29 VITALS — BP 111/73 | HR 61 | Temp 97.5°F | Resp 16 | Ht 61.0 in | Wt 171.0 lb

## 2014-06-29 DIAGNOSIS — N958 Other specified menopausal and perimenopausal disorders: Secondary | ICD-10-CM

## 2014-06-29 DIAGNOSIS — Z01419 Encounter for gynecological examination (general) (routine) without abnormal findings: Secondary | ICD-10-CM

## 2014-06-29 DIAGNOSIS — Z124 Encounter for screening for malignant neoplasm of cervix: Secondary | ICD-10-CM

## 2014-06-29 DIAGNOSIS — Z1239 Encounter for other screening for malignant neoplasm of breast: Secondary | ICD-10-CM

## 2014-06-29 DIAGNOSIS — F329 Major depressive disorder, single episode, unspecified: Secondary | ICD-10-CM

## 2014-06-29 DIAGNOSIS — Z23 Encounter for immunization: Secondary | ICD-10-CM

## 2014-06-29 DIAGNOSIS — Z130 Encounter for screening for diseases of the blood and blood-forming organs and certain disorders involving the immune mechanism: Secondary | ICD-10-CM

## 2014-06-29 DIAGNOSIS — E038 Other specified hypothyroidism: Secondary | ICD-10-CM

## 2014-06-29 DIAGNOSIS — Z13220 Encounter for screening for lipoid disorders: Secondary | ICD-10-CM

## 2014-06-29 DIAGNOSIS — F32A Depression, unspecified: Secondary | ICD-10-CM

## 2014-06-29 DIAGNOSIS — Z0000 Encounter for general adult medical examination without abnormal findings: Secondary | ICD-10-CM

## 2014-06-29 DIAGNOSIS — Z1211 Encounter for screening for malignant neoplasm of colon: Secondary | ICD-10-CM

## 2014-06-29 DIAGNOSIS — E8940 Asymptomatic postprocedural ovarian failure: Secondary | ICD-10-CM

## 2014-06-29 LAB — CBC
HCT: 41.6 % (ref 36.0–46.0)
Hemoglobin: 14.2 g/dL (ref 12.0–15.0)
MCH: 27.3 pg (ref 26.0–34.0)
MCHC: 34.1 g/dL (ref 30.0–36.0)
MCV: 79.8 fL (ref 78.0–100.0)
Platelets: 254 10*3/uL (ref 150–400)
RBC: 5.21 MIL/uL — ABNORMAL HIGH (ref 3.87–5.11)
RDW: 14.1 % (ref 11.5–15.5)
WBC: 7 10*3/uL (ref 4.0–10.5)

## 2014-06-29 LAB — COMPLETE METABOLIC PANEL WITH GFR
Alkaline Phosphatase: 59 U/L (ref 39–117)
BUN: 17 mg/dL (ref 6–23)
Chloride: 102 mEq/L (ref 96–112)
GFR, Est Non African American: 47 mL/min — ABNORMAL LOW
Glucose, Bld: 81 mg/dL (ref 70–99)
Sodium: 139 mEq/L (ref 135–145)
Total Bilirubin: 0.5 mg/dL (ref 0.2–1.2)
Total Protein: 6.5 g/dL (ref 6.0–8.3)

## 2014-06-29 LAB — COMPLETE METABOLIC PANEL WITHOUT GFR
ALT: 15 U/L (ref 0–35)
AST: 16 U/L (ref 0–37)
Albumin: 4.4 g/dL (ref 3.5–5.2)
CO2: 27 meq/L (ref 19–32)
Calcium: 9.2 mg/dL (ref 8.4–10.5)
Creat: 1.27 mg/dL — ABNORMAL HIGH (ref 0.50–1.10)
GFR, Est African American: 55 mL/min — ABNORMAL LOW
Potassium: 4.5 meq/L (ref 3.5–5.3)

## 2014-06-29 LAB — LIPID PANEL
Cholesterol: 209 mg/dL — ABNORMAL HIGH (ref 0–200)
HDL: 73 mg/dL (ref >39)
LDL Cholesterol: 120 mg/dL — ABNORMAL HIGH (ref 0–99)
Total CHOL/HDL Ratio: 2.9 Ratio
Triglycerides: 78 mg/dL (ref <150)
VLDL: 16 mg/dL (ref 0–40)

## 2014-06-29 LAB — TSH: TSH: 1.233 u[IU]/mL (ref 0.350–4.500)

## 2014-06-29 MED ORDER — BUPROPION HCL ER (XL) 300 MG PO TB24: ORAL_TABLET | ORAL | Status: DC

## 2014-06-29 MED ORDER — LEVOTHYROXINE SODIUM 137 MCG PO TABS: ORAL_TABLET | ORAL | Status: DC

## 2014-06-29 MED ORDER — LIOTHYRONINE SODIUM 5 MCG PO TABS: 5.0000 ug | ORAL_TABLET | Freq: Every day | ORAL | Status: DC

## 2014-06-29 MED ORDER — ESTROGENS CONJUGATED 0.3 MG PO TABS: ORAL_TABLET | ORAL | Status: DC

## 2014-06-29 NOTE — Patient Instructions (Signed)
Influenza Vaccine (Flu Vaccine, Inactivated or Recombinant) 2014-2015: What You Need to Know 1. Why get vaccinated? Influenza ("flu") is a contagious disease that spreads around the [place] every winter, usually between October and May. Flu is caused by influenza viruses, and is spread mainly by coughing, sneezing, and close contact. Anyone can get flu, but the risk of getting flu is highest among children. Symptoms come on suddenly and may last several days. They can include:  fever/chills  sore throat  muscle aches  fatigue  cough  headache  runny or stuffy nose Flu can make some people much sicker than others. These people include young children, people 65 and older, pregnant women, and people with certain health conditions-such as heart, lung or kidney disease, nervous system disorders, or a weakened immune system. Flu vaccination is especially important for these people, and anyone in close contact with them. Flu can also lead to pneumonia, and make existing medical conditions worse. It can cause diarrhea and seizures in children. Each year thousands of people in the [place] die from flu, and many more are hospitalized. Flu vaccine is the best protection against flu and its complications. Flu vaccine also helps prevent spreading flu from person to person. 2. Inactivated and recombinant flu vaccines You are getting an injectable flu vaccine, which is either an "inactivated" or "recombinant" vaccine. These vaccines do not contain any live influenza virus. They are given by injection with a needle, and often called the "flu shot."  A different live, attenuated (weakened) influenza vaccine is sprayed into the nostrils. This vaccine is described in a separate Vaccine Information Statement. Flu vaccination is recommended every year. Some children 6 months through 8 years of age might need two doses during one year. Flu viruses are always changing. Each year's flu vaccine is made  to protect against 3 or 4 viruses that are likely to cause disease that year. Flu vaccine cannot prevent all cases of flu, but it is the best defense against the disease.  It takes about 2 weeks for protection to develop after the vaccination, and protection lasts several months to a year. Some illnesses that are not caused by influenza virus are often mistaken for flu. Flu vaccine will not prevent these illnesses. It can only prevent influenza. Some inactivated flu vaccine contains a very small amount of a mercury-based preservative called thimerosal. Studies have shown that thimerosal in vaccines is not harmful, but flu vaccines that do not contain a preservative are available. 3. Some people should not get this vaccine Tell the person who gives you the vaccine:  If you have any severe, life-threatening allergies. If you ever had a life-threatening allergic reaction after a dose of flu vaccine, or have a severe allergy to any part of this vaccine, including (for example) an allergy to gelatin, antibiotics, or eggs, you may be advised not to get vaccinated. Most, but not all, types of flu vaccine contain a small amount of egg protein.  If you ever had Guillain-Barr Syndrome (a severe paralyzing illness, also called GBS). Some people with a history of GBS should not get this vaccine. This should be discussed with your doctor.  If you are not feeling well. It is usually okay to get flu vaccine when you have a mild illness, but you might be advised to wait until you feel better. You should come back when you are better. 4. Risks of a vaccine reaction With a vaccine, like any medicine, there is a chance of side   effects. These are usually mild and go away on their own. Problems that could happen after any vaccine:  Brief fainting spells can happen after any medical procedure, including vaccination. Sitting or lying down for about 15 minutes can help prevent fainting, and injuries caused by a fall. Tell  your doctor if you feel dizzy, or have vision changes or ringing in the ears.  Severe shoulder pain and reduced range of motion in the arm where a shot was given can happen, very rarely, after a vaccination.  Severe allergic reactions from a vaccine are very rare, estimated at less than 1 in a million doses. If one were to occur, it would usually be within a few minutes to a few hours after the vaccination. Mild problems following inactivated flu vaccine:  soreness, redness, or swelling where the shot was given  hoarseness  sore, red or itchy eyes  cough  fever  aches  headache  itching  fatigue If these problems occur, they usually begin soon after the shot and last 1 or 2 days. Moderate problems following inactivated flu vaccine:  Young children who get inactivated flu vaccine and pneumococcal vaccine (PCV13) at the same time may be at increased risk for seizures caused by fever. Ask your doctor for more information. Tell your doctor if a child who is getting flu vaccine has ever had a seizure. Inactivated flu vaccine does not contain live flu virus, so you cannot get the flu from this vaccine. As with any medicine, there is a very remote chance of a vaccine causing a serious injury or death. The safety of vaccines is always being monitored. For more information, visit: www.cdc.gov/vaccinesafety/ 5. What if there is a serious reaction? What should I look for?  Look for anything that concerns you, such as signs of a severe allergic reaction, very high fever, or behavior changes. Signs of a severe allergic reaction can include hives, swelling of the face and throat, difficulty breathing, a fast heartbeat, dizziness, and weakness. These would start a few minutes to a few hours after the vaccination. What should I do?  If you think it is a severe allergic reaction or other emergency that can't wait, call 9-1-1 and get the person to the nearest hospital. Otherwise, call your  doctor.  Afterward, the reaction should be reported to the Vaccine Adverse Event Reporting System (VAERS). Your doctor should file this report, or you can do it yourself through the VAERS web site at www.vaers.hhs.gov, or by calling 1-800-822-7967. VAERS does not give medical advice. 6. The National Vaccine Injury Compensation Program The National Vaccine Injury Compensation Program (VICP) is a federal program that was created to compensate people who may have been injured by certain vaccines. Persons who believe they may have been injured by a vaccine can learn about the program and about filing a claim by calling 1-800-338-2382 or visiting the VICP website at www.hrsa.gov/vaccinecompensation. There is a time limit to file a claim for compensation. 7. How can I learn more?  Ask your health care provider.  Call your local or state health department.  Contact the Centers for Disease Control and Prevention (CDC):  Call 1-800-232-4636 (1-800-CDC-INFO) or  Visit CDC's website at www.cdc.gov/flu CDC Vaccine Information Statement (Interim) Inactivated Influenza Vaccine (05/16/2013) Document Released: 07/09/2006 Document Revised: 01/29/2014 Document Reviewed: 09/01/2013 Exit[employer] Patient Information 2015 [employer]. This information is not intended to replace advice given to you by your health care provider. Make sure you discuss any questions you have with your health   care provider.  

## 2014-06-29 NOTE — Progress Notes (Signed)
Chief Complaint:  Chief Complaint  Patient presents with  . Annual Exam    ?pap?    HPI: Lacey Wilson is a 58 y.o. female who is here for annual and med refills Doing well  Without complaints She has had colonscopy 2005 @ [facility], hx of polyps on prior colonscopy Mammogram is not UTD for this year, she states her insurance had switched and she used to go to the breast Center and was  Not sure if they take her insurance Has had a hysterectomy , first partial then full for  Benign fibroids with heavy bleeding LAst pap was negative in 2010 done here in the office, she would still like a pap eventhough she doe snto have a cervix No family hx of breast, uterine or cervical cancer She is G2L2 She has thyroid disease and has been on meds for over 20 years, she was given cytomel and also levothyrozine together because it worked better She is on Wellbutrin for depression, doing well, No sxs, No SI/HI/hallucinations She is taking care of her dad, she is only on wellbutrin She is allergic to tetanus so not UTD on it   Past Medical History  Diagnosis Date  . Allergy   . Thyroid disease   . Depression   . Anxiety    Past Surgical History  Procedure Laterality Date  . Appendectomy    . Cholecystectomy    . Abdominal hysterectomy     History   Social History  . Marital Status: Divorced    Spouse Name: N/A    Number of Children: N/A  . Years of Education: N/A   Social History Main Topics  . Smoking status: Never Smoker   . Smokeless tobacco: None  . Alcohol Use: No  . Drug Use: No  . Sexual Activity: Yes    Birth Control/ Protection: Post-menopausal   Other Topics Concern  . 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 Depression Sister   . Thyroid disease Sister   . Ovarian cancer Mother   . Prostate cancer Father    Allergies  Allergen Reactions  . Amoxicillin    REACTION: rash  . Tetanus Toxoid     REACTION: bad swelling and inflammation   Prior to Admission medications   Medication Sig Start Date End Date Taking? Authorizing Provider  buPROPion (WELLBUTRIN XL) 300 MG 24 hr tablet take 1 tablet by mouth once daily 06/07/14  Yes Morrell RiddleSarah L Weber, PA-C  clonazePAM (KLONOPIN) 0.5 MG tablet 1/2-1 tablet up to 3x daily prn anxiety 11/11/12  Yes Anders SimmondsAngela M McClung, PA-C  estrogens, conjugated, (PREMARIN) 0.3 MG tablet 1 PO DAILY 02/22/13  Yes Morrell RiddleSarah L Weber, PA-C  liothyronine (CYTOMEL) 5 MCG tablet Take 1 tablet (5 mcg total) by mouth daily. 11/11/12  Yes Anders SimmondsAngela M McClung, PA-C  SYNTHROID 137 MCG tablet take 1 tablet by mouth once daily 10/24/13  Yes Eleanore E Egan, PA-C  diclofenac (VOLTAREN) 75 MG EC tablet Take 1 tablet (75 mg total) by mouth 2 (two) times daily. 04/10/14   Morrell RiddleSarah L Weber, PA-C  fluticasone (FLONASE) 50 MCG/ACT nasal spray Place 2 sprays into the nose daily. 02/22/13 04/10/14  Morrell RiddleSarah L Weber, PA-C  traZODone (DESYREL) 100 MG tablet Take 1 tablet (100 mg total) by mouth at bedtime. 08/11/13   Godfrey PickEleanore E Egan, PA-C  ROS: The patient denies fevers, chills, night sweats, unintentional weight loss, chest pain, palpitations, wheezing, dyspnea on exertion, nausea, vomiting, abdominal pain, dysuria, hematuria, melena, numbness, weakness, or tingling.   All other systems have been reviewed and were otherwise negative with the exception of those mentioned in the HPI and as above.    PHYSICAL EXAM: Filed Vitals:   06/29/14 1054  BP: 111/73  Pulse: 61  Temp: 97.5 F (36.4 C)  Resp: 16   Filed Vitals:   06/29/14 1054  Height: 5\' 1"  (1.549 m)  Weight: 171 lb (77.565 kg)   Body mass index is 32.33 kg/(m^2).  General: Alert, no acute distress HEENT:  Normocephalic, atraumatic, oropharynx patent. EOMI, PERRLA, no thyroidmegaly, fundo exam normal,  Cardiovascular:  Regular rate and rhythm, no rubs murmurs or gallops.  No Carotid bruits, radial pulse  intact. No pedal edema.  Respiratory: Clear to auscultation bilaterally.  No wheezes, rales, or rhonchi.  No cyanosis, no use of accessory musculature GI: No organomegaly, abdomen is soft and non-tender, positive bowel sounds.  No masses. Skin: No rashes. Neurologic: Facial musculature symmetric. Psychiatric: Patient is appropriate throughout our interaction. Lymphatic: No cervical lymphadenopathy Musculoskeletal: Gait intact. Breast exam normal GU-no cervix, no masses, lesion, vaginal vault looks nl, nontender   LABS: Results for orders placed in visit on 08/11/13  TSH      Result Value Ref Range   TSH 1.080  0.350 - 4.500 uIU/mL  VITAMIN D 25 HYDROXY      Result Value Ref Range   Vit D, 25-Hydroxy 40  30 - 89 ng/mL  T3, FREE      Result Value Ref Range   T3, Free 2.9  2.3 - 4.2 pg/mL  T4, FREE      Result Value Ref Range   Free T4 1.46  0.80 - 1.80 ng/dL  POCT CBC      Result Value Ref Range   WBC 6.5  4.6 - 10.2 K/uL   Lymph, poc 2.3  0.6 - 3.4   POC LYMPH PERCENT 35.4  10 - 50 %L   MID (cbc) 0.3  0 - 0.9   POC MID % 5.1  0 - 12 %M   POC Granulocyte 3.9  2 - 6.9   Granulocyte percent 59.5  37 - 80 %G   RBC 5.05  4.04 - 5.48 M/uL   Hemoglobin 13.6  12.2 - 16.2 g/dL   HCT, POC 16.1  09.6 - 47.9 %   MCV 86.5  80 - 97 fL   MCH, POC 26.9 (*) 27 - 31.2 pg   MCHC 31.1 (*) 31.8 - 35.4 g/dL   RDW, POC 04.5     Platelet Count, POC 268  142 - 424 K/uL   MPV 8.7  0 - 99.8 fL     EKG/XRAY:   Primary read interpreted by Dr. Conley Rolls at [facility].   ASSESSMENT/PLAN: Encounter Diagnoses  Name Primary?  . Need for influenza vaccination   . Annual physical exam Yes  . Other specified hypothyroidism   . Screening for deficiency anemia   . Screening for breast cancer   . Depression   . Screening for hyperlipidemia   . Screening for cervical cancer   . Screening for colon cancer   . Surgical menopause    Refilled meds x 1 year. Needs colon cancer screening-refer to [facility] Needs  mammogram-refer to breast center sicne she has gone there before Flu vaccine given Labs and pap pending F/u prn otherwise  in 6 mos-1 year  Gross sideeffects, risk and benefits, and alternatives of medications d/w patient. Patient is aware that all medications have potential sideeffects and we are unable to predict every sideeffect or drug-drug interaction that may occur.  Tarnisha Kachmar PHUONG, DO 06/29/2014 11:45 AM

## 2014-07-02 ENCOUNTER — Encounter: Payer: Self-pay | Admitting: Gastroenterology

## 2014-07-02 LAB — PAP IG W/ RFLX HPV ASCU

## 2014-07-12 ENCOUNTER — Ambulatory Visit
Admission: RE | Admit: 2014-07-12 | Discharge: 2014-07-12 | Disposition: A | Discharge status: Home / Self Care | Payer: BC Managed Care – PPO | Source: Ambulatory Visit | Attending: Family Medicine | Admitting: Family Medicine

## 2014-07-12 DIAGNOSIS — Z1239 Encounter for other screening for malignant neoplasm of breast: Secondary | ICD-10-CM

## 2014-11-26 ENCOUNTER — Ambulatory Visit (INDEPENDENT_AMBULATORY_CARE_PROVIDER_SITE_OTHER): Payer: BLUE CROSS/BLUE SHIELD | Admitting: Internal Medicine

## 2014-11-26 VITALS — BP 110/80 | HR 60 | Temp 97.8°F | Resp 16 | Ht 61.5 in | Wt 178.0 lb

## 2014-11-26 DIAGNOSIS — J3089 Other allergic rhinitis: Secondary | ICD-10-CM

## 2014-11-26 DIAGNOSIS — J320 Chronic maxillary sinusitis: Secondary | ICD-10-CM

## 2014-11-26 MED ORDER — AZITHROMYCIN 500 MG PO TABS
500.0000 mg | ORAL_TABLET | Freq: Every day | ORAL | Status: DC
Start: 2014-11-26 — End: 2015-07-18

## 2014-11-26 NOTE — Progress Notes (Signed)
   Subjective:    Patient ID: Lacey Wilson, female    DOB: May 03, 1956, 59 y.o.   MRN: [id number]  HPI 59 yo with congestion facial pain, ha, foul nasal discharge. All triggered by allergys. Also has hx of migraine No cough, sob, cp. No focal NMSV changes..    Review of Systems     Objective:   Physical Exam  Constitutional: She is oriented to person, place, and time. She appears well-developed and well-nourished. No distress.  HENT:  Head: Normocephalic.  Right Ear: External ear normal.  Left Ear: External ear normal.  Nose: Mucosal edema, rhinorrhea and sinus tenderness present. Right sinus exhibits no maxillary sinus tenderness and no frontal sinus tenderness. Left sinus exhibits maxillary sinus tenderness. Left sinus exhibits no frontal sinus tenderness.  Mouth/Throat: Oropharynx is clear and moist.  Eyes: Conjunctivae and EOM are normal. Pupils are equal, round, and reactive to light.  Neck: Normal range of motion. Neck supple.  Cardiovascular: Normal rate.   Pulmonary/Chest: Effort normal.  Musculoskeletal: Normal range of motion.  Lymphadenopathy:    She has no cervical adenopathy.  Neurological: She is alert and oriented to person, place, and time. She exhibits normal muscle tone. Coordination normal.  Psychiatric: She has a normal mood and affect.  Vitals reviewed.         Assessment & Plan:  Left maxillary sinusitis Zithromax 500mg  5d Allergy care

## 2014-11-26 NOTE — Patient Instructions (Signed)

## 2015-06-27 ENCOUNTER — Other Ambulatory Visit: Payer: Self-pay

## 2015-06-27 DIAGNOSIS — Z1231 Encounter for screening mammogram for malignant neoplasm of breast: Secondary | ICD-10-CM

## 2015-07-05 ENCOUNTER — Other Ambulatory Visit: Payer: Self-pay | Admitting: Family Medicine

## 2015-07-11 ENCOUNTER — Other Ambulatory Visit: Payer: Self-pay | Admitting: Family Medicine

## 2015-07-11 ENCOUNTER — Encounter: Payer: BLUE CROSS/BLUE SHIELD | Admitting: Physician Assistant

## 2015-07-15 ENCOUNTER — Ambulatory Visit
Admission: RE | Admit: 2015-07-15 | Discharge: 2015-07-15 | Disposition: A | Discharge status: Home / Self Care | Payer: BLUE CROSS/BLUE SHIELD | Source: Ambulatory Visit

## 2015-07-15 DIAGNOSIS — Z1231 Encounter for screening mammogram for malignant neoplasm of breast: Secondary | ICD-10-CM

## 2015-07-17 ENCOUNTER — Other Ambulatory Visit: Payer: Self-pay | Admitting: Physician Assistant

## 2015-07-17 DIAGNOSIS — R928 Other abnormal and inconclusive findings on diagnostic imaging of breast: Secondary | ICD-10-CM

## 2015-07-18 ENCOUNTER — Ambulatory Visit (INDEPENDENT_AMBULATORY_CARE_PROVIDER_SITE_OTHER): Payer: BLUE CROSS/BLUE SHIELD | Admitting: Physician Assistant

## 2015-07-18 ENCOUNTER — Encounter: Payer: Self-pay | Admitting: Physician Assistant

## 2015-07-18 VITALS — BP 110/74 | HR 73 | Temp 97.7°F | Resp 16 | Ht 61.0 in | Wt 170.2 lb

## 2015-07-18 DIAGNOSIS — E038 Other specified hypothyroidism: Secondary | ICD-10-CM

## 2015-07-18 DIAGNOSIS — Z23 Encounter for immunization: Secondary | ICD-10-CM

## 2015-07-18 DIAGNOSIS — Z91048 Other nonmedicinal substance allergy status: Secondary | ICD-10-CM | POA: Diagnosis not present

## 2015-07-18 DIAGNOSIS — Z130 Encounter for screening for diseases of the blood and blood-forming organs and certain disorders involving the immune mechanism: Secondary | ICD-10-CM

## 2015-07-18 DIAGNOSIS — Z0000 Encounter for general adult medical examination without abnormal findings: Secondary | ICD-10-CM | POA: Diagnosis not present

## 2015-07-18 DIAGNOSIS — Z13228 Encounter for screening for other metabolic disorders: Secondary | ICD-10-CM | POA: Diagnosis not present

## 2015-07-18 DIAGNOSIS — F32A Depression, unspecified: Secondary | ICD-10-CM

## 2015-07-18 DIAGNOSIS — Z9109 Other allergy status, other than to drugs and biological substances: Secondary | ICD-10-CM

## 2015-07-18 DIAGNOSIS — N958 Other specified menopausal and perimenopausal disorders: Secondary | ICD-10-CM | POA: Diagnosis not present

## 2015-07-18 DIAGNOSIS — E7800 Pure hypercholesterolemia, unspecified: Secondary | ICD-10-CM

## 2015-07-18 DIAGNOSIS — Z90710 Acquired absence of both cervix and uterus: Secondary | ICD-10-CM

## 2015-07-18 DIAGNOSIS — R300 Dysuria: Secondary | ICD-10-CM | POA: Diagnosis not present

## 2015-07-18 DIAGNOSIS — IMO0002 Reserved for concepts with insufficient information to code with codable children: Secondary | ICD-10-CM

## 2015-07-18 DIAGNOSIS — E034 Atrophy of thyroid (acquired): Secondary | ICD-10-CM

## 2015-07-18 DIAGNOSIS — E8940 Asymptomatic postprocedural ovarian failure: Secondary | ICD-10-CM

## 2015-07-18 DIAGNOSIS — F329 Major depressive disorder, single episode, unspecified: Secondary | ICD-10-CM | POA: Diagnosis not present

## 2015-07-18 LAB — CBC WITH DIFFERENTIAL/PLATELET
Basophils Absolute: 0 10*3/uL (ref 0.0–0.1)
Basophils Relative: 0 % (ref 0–1)
EOS PCT: 1 % (ref 0–5)
Eosinophils Absolute: 0.1 10*3/uL (ref 0.0–0.7)
HEMATOCRIT: 38.6 % (ref 36.0–46.0)
HEMOGLOBIN: 12.8 g/dL (ref 12.0–15.0)
LYMPHS PCT: 30 % (ref 12–46)
Lymphs Abs: 2.3 10*3/uL (ref 0.7–4.0)
MCH: 26.4 pg (ref 26.0–34.0)
MCHC: 33.2 g/dL (ref 30.0–36.0)
MCV: 79.6 fL (ref 78.0–100.0)
MONO ABS: 0.5 10*3/uL (ref 0.1–1.0)
MONOS PCT: 7 % (ref 3–12)
MPV: 10 fL (ref 8.6–12.4)
NEUTROS ABS: 4.7 10*3/uL (ref 1.7–7.7)
Neutrophils Relative %: 62 % (ref 43–77)
Platelets: 268 10*3/uL (ref 150–400)
RBC: 4.85 MIL/uL (ref 3.87–5.11)
RDW: 13.8 % (ref 11.5–15.5)
WBC: 7.5 10*3/uL (ref 4.0–10.5)

## 2015-07-18 LAB — POC MICROSCOPIC URINALYSIS (UMFC): MUCUS RE: ABSENT

## 2015-07-18 LAB — POCT URINALYSIS DIP (MANUAL ENTRY)
BILIRUBIN UA: NEGATIVE
BILIRUBIN UA: NEGATIVE
Blood, UA: NEGATIVE
Glucose, UA: NEGATIVE
LEUKOCYTES UA: NEGATIVE
Nitrite, UA: NEGATIVE
PH UA: 5.5
PROTEIN UA: NEGATIVE
SPEC GRAV UA: 1.015
Urobilinogen, UA: 0.2

## 2015-07-18 LAB — COMPLETE METABOLIC PANEL WITH GFR
ALBUMIN: 4.3 g/dL (ref 3.6–5.1)
ALK PHOS: 50 U/L (ref 33–130)
ALT: 18 U/L (ref 6–29)
AST: 18 U/L (ref 10–35)
BUN: 15 mg/dL (ref 7–25)
CALCIUM: 9.2 mg/dL (ref 8.6–10.4)
CHLORIDE: 101 mmol/L (ref 98–110)
CO2: 29 mmol/L (ref 20–31)
Creat: 1.14 mg/dL — ABNORMAL HIGH (ref 0.50–1.05)
GFR, EST AFRICAN AMERICAN: 62 mL/min (ref >=60)
GFR, EST NON AFRICAN AMERICAN: 54 mL/min — AB (ref >=60)
Glucose, Bld: 72 mg/dL (ref 65–99)
POTASSIUM: 4.1 mmol/L (ref 3.5–5.3)
Sodium: 139 mmol/L (ref 135–146)
Total Bilirubin: 0.5 mg/dL (ref 0.2–1.2)
Total Protein: 6.7 g/dL (ref 6.1–8.1)

## 2015-07-18 LAB — LIPID PANEL
CHOL/HDL RATIO: 3.1 ratio (ref <=5.0)
CHOLESTEROL: 228 mg/dL — AB (ref 125–200)
HDL: 73 mg/dL (ref >=46)
LDL Cholesterol: 137 mg/dL — ABNORMAL HIGH (ref <130)
TRIGLYCERIDES: 92 mg/dL (ref <150)
VLDL: 18 mg/dL (ref <30)

## 2015-07-18 MED ORDER — ESTROGENS CONJUGATED 0.3 MG PO TABS: ORAL_TABLET | ORAL | Status: DC

## 2015-07-18 MED ORDER — FLUTICASONE PROPIONATE 50 MCG/ACT NA SUSP: 2.0000 | Freq: Every day | NASAL | Status: AC

## 2015-07-18 MED ORDER — BUPROPION HCL ER (XL) 300 MG PO TB24: ORAL_TABLET | ORAL | Status: DC

## 2015-07-18 NOTE — Progress Notes (Signed)
Lacey Wilson  MRN: [id number] DOB: 12-31-1955  Subjective:  Pt presents to clinic for her yearly CPE.    Twinge of pain with urination over the last several weeks without any other complaints - drinking a lot of water - not other urinary complaints - no vaginal dryness and no change in sexualy partners.  Last dental exam: 9/16 Last vision exam: due Last pap smear: last year normal - pt has had a hyst she does not need more pap smears Last mammogram: 10/17 - abnormal scheduled 10/25 diagnostic and [place] scheduled -she is a little nervous about what might happen Last colonoscopy: 2009 - normal repeat in 10 years Vaccinations - UTD - cannot get tetanus due to severe SE       Patient Active Problem List   Diagnosis Date Noted  . H/O hysterectomy with oophorectomy 07/18/2015  . Depression 04/20/2012  . Hypothyroidism due to acquired atrophy of thyroid 01/28/2012    Current Outpatient Prescriptions on File Prior to Visit  Medication Sig Dispense Refill  . diclofenac (VOLTAREN) 75 MG EC tablet Take 1 tablet (75 mg total) by mouth 2 (two) times daily. 60 tablet 0  . levothyroxine (SYNTHROID) 137 MCG tablet TAKE 1 TABLET BY MOUTH ONCE DAILY 90 tablet 0  . liothyronine (CYTOMEL) 5 MCG tablet TAKE 1 TABLET BY MOUTH ONCE DAILY 90 tablet 0   No current facility-administered medications on file prior to visit.    Allergies  Allergen Reactions  . Amoxicillin     REACTION: rash  . Tetanus Toxoid     REACTION: bad swelling and inflammation    Social History   Social History  . Marital Status: Divorced    Spouse Name: N/A  . Number of Children: N/A  . Years of Education: N/A   Social History Main Topics  . Smoking status: Never Smoker   . Smokeless tobacco: None  . Alcohol Use: No  . Drug Use: No  . Sexual Activity: Yes    Birth Control/ Protection: Post-menopausal   Other Topics Concern  . 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 Appendectomy    . Cholecystectomy    . Abdominal hysterectomy      Family History  Problem Relation Age of Onset  . Depression Sister   . Thyroid disease Sister   . Ovarian cancer Mother   . Prostate cancer Father     Review of Systems  Constitutional: Negative.   HENT: Positive for rhinorrhea (related to allergies).   Eyes: Negative.   Respiratory: Negative.   Cardiovascular: Negative.   Gastrointestinal: Negative.   Endocrine: Negative.   Genitourinary: Positive for dysuria. Negative for urgency, frequency, hematuria, vaginal discharge and difficulty urinating.       No change in sexually partner  Musculoskeletal: Positive for arthralgias.       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   Allergic/Immunologic: Positive for environmental allergies.  Neurological: Negative.   Hematological: Negative.   Psychiatric/Behavioral: Negative.    Objective:  BP 110/74 mmHg  Pulse 73  Temp(Src) 97.7 F (36.5 C) (Oral)  Resp 16  Ht  (1.549 m)  Wt 170 lb 3.2 oz (77.202  kg)  BMI 32.18 kg/m2  Physical Exam  Constitutional: She is oriented to person, place, and time and well-developed, well-nourished, and in no distress.  HENT:  Head: Normocephalic and atraumatic.  Right Ear: Hearing, tympanic membrane, external ear and ear canal normal.  Left Ear: Hearing, tympanic membrane, external ear and ear canal normal.  Nose: Nose normal.  Mouth/Throat: Uvula is midline, oropharynx is clear and moist and mucous membranes are normal.  Eyes: Conjunctivae and EOM are normal. Pupils are equal, round, and reactive to light.  Neck: Trachea normal and normal range of motion. Neck supple. No thyroid mass and no thyromegaly present.  Cardiovascular: Normal rate, regular rhythm and normal heart sounds.     No murmur heard. Pulmonary/Chest: Effort normal and breath sounds normal. She has no wheezes.  Abdominal: Soft. Bowel sounds are normal. There is no tenderness.  Musculoskeletal: Normal range of motion.  Lymphadenopathy:    She has no cervical adenopathy.  Neurological: She is alert and oriented to person, place, and time. She has normal motor skills, normal sensation, normal strength and normal reflexes. Gait normal.  Skin: Skin is warm and dry.  Psychiatric: Mood, memory, affect and judgment normal.   Results for orders placed or performed in visit on 07/18/15  POCT urinalysis dipstick  Result Value Ref Range   Color, UA light yellow (A) yellow   Clarity, UA clear clear   Glucose, UA negative negative   Bilirubin, UA negative negative   Ketones, POC UA negative negative   Spec Grav, UA 1.015    Blood, UA negative negative   pH, UA 5.5    Protein Ur, POC negative negative   Urobilinogen, UA 0.2    Nitrite, UA Negative Negative   Leukocytes, UA Negative Negative  POCT Microscopic Urinalysis ([facility])  Result Value Ref Range   WBC,UR,HPF,POC Few (A) None WBC/hpf   RBC,UR,HPF,POC None None RBC/hpf   Bacteria Few (A) None   Mucus Absent Absent   Epithelial Cells, UR Per Microscopy Few (A) None cells/hpf    Assessment and Plan :  Annual physical exam - anticipatory guidance  Need for prophylactic vaccination and inoculation against influenza - Plan: Flu Vaccine QUAD 36+ mos IM  Environmental allergies - Plan: fluticasone (FLONASE) 50 MCG/ACT nasal spray  Surgical menopause - Plan: estrogens, conjugated, (PREMARIN) 0.3 MG tablet  Hypothyroidism due to acquired atrophy of thyroid - Plan: TSH  Depression - Plan: buPROPion (WELLBUTRIN XL) 300 MG 24 hr tablet  Screening for deficiency anemia - Plan: CBC with Differential/Platelet  Elevated cholesterol - Plan: Lipid panel  Screening for metabolic disorder - Plan: COMPLETE METABOLIC PANEL WITH GFR  H/O hysterectomy with  oophorectomy  Dysuria - Plan: POCT urinalysis dipstick, POCT Microscopic Urinalysis ([facility]) - normal urine exam - pt to continue water and monitor - she will f/u if this does not resolve  Benny LennertSarah Weber PA-C  [facility] 07/18/2015 4:29 PM

## 2015-07-18 NOTE — Patient Instructions (Addendum)
In order for your supplemental calcium to be effective.  Please take calcium either on an empty stomach or with food that does not contain calcium.  Your body is able only to absorb $RemoveB'500mg'LABPFlaH$  of Calcium at a time from any one source.  Do not take with a MVI with iron because iron does not allow th calcium to be absorbed.  Please take Calcium citrate $RemoveBeforeD'500mg'xaUpPomYkzSLoh$  2-3x/day.  This supplement should have Vit D in it and if your pills do not please add addition Vit D 400 IU with each dose.   Health Maintenance, Female Adopting a healthy lifestyle and getting preventive care can go a long way to promote health and wellness. Talk with your health care provider about what schedule of regular examinations is right for you. This is a good chance for you to check in with your provider about disease prevention and staying healthy. In between checkups, there are plenty of things you can do on your own. Experts have done a lot of research about which lifestyle changes and preventive measures are most likely to keep you healthy. Ask your health care provider for more information. WEIGHT AND DIET  Eat a healthy diet  Be sure to include plenty of vegetables, fruits, low-fat dairy products, and lean protein.  Do not eat a lot of foods high in solid fats, added sugars, or salt.  Get regular exercise. This is one of the most important things you can do for your health.  Most adults should exercise for at least 150 minutes each week. The exercise should increase your heart rate and make you sweat (moderate-intensity exercise).  Most adults should also do strengthening exercises at least twice a week. This is in addition to the moderate-intensity exercise.  Maintain a healthy weight  Body mass index (BMI) is a measurement that can be used to identify possible weight problems. It estimates body fat based on height and weight. Your health care provider can help determine your BMI and help you achieve or maintain a healthy  weight.  For females 85 years of age and older:   A BMI below 18.5 is considered underweight.  A BMI of 18.5 to 24.9 is normal.  A BMI of 25 to 29.9 is considered overweight.  A BMI of 30 and above is considered obese.  Watch levels of cholesterol and blood lipids  You should start having your blood tested for lipids and cholesterol at 57 years of age, then have this test every 5 years.  You may need to have your cholesterol levels checked more often if:  Your lipid or cholesterol levels are high.  You are older than 57 years of age.  You are at high risk for heart disease.  CANCER SCREENING   Lung Cancer  Lung cancer screening is recommended for adults 51-43 years old who are at high risk for lung cancer because of a history of smoking.  A yearly low-dose CT scan of the lungs is recommended for people who:  Currently smoke.  Have quit within the past 15 years.  Have at least a 30-pack-year history of smoking. A pack year is smoking an average of one pack of cigarettes a day for 1 year.  Yearly screening should continue until it has been 15 years since you quit.  Yearly screening should stop if you develop a health problem that would prevent you from having lung cancer treatment.  Breast Cancer  Practice breast self-awareness. This means understanding how your breasts normally appear and  feel.  It also means doing regular breast self-exams. Let your health care provider know about any changes, no matter how small.  If you are in your 20s or 30s, you should have a clinical breast exam (CBE) by a health care provider every 1-3 years as part of a regular health exam.  If you are 67 or older, have a CBE every year. Also consider having a breast X-ray (mammogram) every year.  If you have a family history of breast cancer, talk to your health care provider about genetic screening.  If you are at high risk for breast cancer, talk to your health care provider about  having an MRI and a mammogram every year.  Breast cancer gene (BRCA) assessment is recommended for women who have family members with BRCA-related cancers. BRCA-related cancers include:  Breast.  Ovarian.  Tubal.  Peritoneal cancers.  Results of the assessment will determine the need for genetic counseling and BRCA1 and BRCA2 testing. Cervical Cancer Your health care provider may recommend that you be screened regularly for cancer of the pelvic organs (ovaries, uterus, and vagina). This screening involves a pelvic examination, including checking for microscopic changes to the surface of your cervix (Pap test). You may be encouraged to have this screening done every 3 years, beginning at age 55.  For women ages 3-65, health care providers may recommend pelvic exams and Pap testing every 3 years, or they may recommend the Pap and pelvic exam, combined with testing for human papilloma virus (HPV), every 5 years. Some types of HPV increase your risk of cervical cancer. Testing for HPV may also be done on women of any age with unclear Pap test results.  Other health care providers may not recommend any screening for nonpregnant women who are considered low risk for pelvic cancer and who do not have symptoms. Ask your health care provider if a screening pelvic exam is right for you.  If you have had past treatment for cervical cancer or a condition that could lead to cancer, you need Pap tests and screening for cancer for at least 20 years after your treatment. If Pap tests have been discontinued, your risk factors (such as having a new sexual partner) need to be reassessed to determine if screening should resume. Some women have medical problems that increase the chance of getting cervical cancer. In these cases, your health care provider may recommend more frequent screening and Pap tests. Colorectal Cancer  This type of cancer can be detected and often prevented.  Routine colorectal cancer  screening usually begins at 57 years of age and continues through 57 years of age.  Your health care provider may recommend screening at an earlier age if you have risk factors for colon cancer.  Your health care provider may also recommend using home test kits to check for hidden blood in the stool.  A small camera at the end of a tube can be used to examine your colon directly (sigmoidoscopy or colonoscopy). This is done to check for the earliest forms of colorectal cancer.  Routine screening usually begins at age 54.  Direct examination of the colon should be repeated every 5-10 years through 57 years of age. However, you may need to be screened more often if early forms of precancerous polyps or small growths are found. Skin Cancer  Check your skin from head to toe regularly.  Tell your health care provider about any new moles or changes in moles, especially if there is a change  in a mole's shape or color.  Also tell your health care provider if you have a mole that is larger than the size of a pencil eraser.  Always use sunscreen. Apply sunscreen liberally and repeatedly throughout the day.  Protect yourself by wearing long sleeves, pants, a wide-brimmed hat, and sunglasses whenever you are outside. HEART DISEASE, DIABETES, AND HIGH BLOOD PRESSURE   High blood pressure causes heart disease and increases the risk of stroke. High blood pressure is more likely to develop in:  People who have blood pressure in the high end of the normal range (130-139/85-89 mm Hg).  People who are overweight or obese.  People who are African American.  If you are 30-82 years of age, have your blood pressure checked every 3-5 years. If you are 59 years of age or older, have your blood pressure checked every year. You should have your blood pressure measured twice--once when you are at a hospital or clinic, and once when you are not at a hospital or clinic. Record the average of the two measurements.  To check your blood pressure when you are not at a hospital or clinic, you can use:  An automated blood pressure machine at a pharmacy.  A home blood pressure monitor.  If you are between 87 years and 74 years old, ask your health care provider if you should take aspirin to prevent strokes.  Have regular diabetes screenings. This involves taking a blood sample to check your fasting blood sugar level.  If you are at a normal weight and have a low risk for diabetes, have this test once every three years after 57 years of age.  If you are overweight and have a high risk for diabetes, consider being tested at a younger age or more often. PREVENTING INFECTION  Hepatitis B  If you have a higher risk for hepatitis B, you should be screened for this virus. You are considered at high risk for hepatitis B if:  You were born in a country where hepatitis B is common. Ask your health care provider which countries are considered high risk.  Your parents were born in a high-risk country, and you have not been immunized against hepatitis B (hepatitis B vaccine).  You have HIV or AIDS.  You use needles to inject street drugs.  You live with someone who has hepatitis B.  You have had sex with someone who has hepatitis B.  You get hemodialysis treatment.  You take certain medicines for conditions, including cancer, organ transplantation, and autoimmune conditions. Hepatitis C  Blood testing is recommended for:  Everyone born from [birth year] through 1963.  Anyone with known risk factors for hepatitis C. Sexually transmitted infections (STIs)  You should be screened for sexually transmitted infections (STIs) including gonorrhea and chlamydia if:  You are sexually active and are younger than 57 years of age.  You are older than 57 years of age and your health care provider tells you that you are at risk for this type of infection.  Your sexual activity has changed since you were last screened and  you are at an increased risk for chlamydia or gonorrhea. Ask your health care provider if you are at risk.  If you do not have HIV, but are at risk, it may be recommended that you take a prescription medicine daily to prevent HIV infection. This is called pre-exposure prophylaxis (PrEP). You are considered at risk if:  You are sexually active and do not regularly use condoms  or know the HIV status of your partner(s).  You take drugs by injection.  You are sexually active with a partner who has HIV. Talk with your health care provider about whether you are at high risk of being infected with HIV. If you choose to begin PrEP, you should first be tested for HIV. You should then be tested every 3 months for as long as you are taking PrEP.  PREGNANCY   If you are premenopausal and you may become pregnant, ask your health care provider about preconception counseling.  If you may become pregnant, take 400 to 800 micrograms (mcg) of folic acid every day.  If you want to prevent pregnancy, talk to your health care provider about birth control (contraception). OSTEOPOROSIS AND MENOPAUSE   Osteoporosis is a disease in which the bones lose minerals and strength with aging. This can result in serious bone fractures. Your risk for osteoporosis can be identified using a bone density scan.  If you are 62 years of age or older, or if you are at risk for osteoporosis and fractures, ask your health care provider if you should be screened.  Ask your health care provider whether you should take a calcium or vitamin D supplement to lower your risk for osteoporosis.  Menopause may have certain physical symptoms and risks.  Hormone replacement therapy may reduce some of these symptoms and risks. Talk to your health care provider about whether hormone replacement therapy is right for you.  HOME CARE INSTRUCTIONS   Schedule regular health, dental, and eye exams.  Stay current with your immunizations.   Do  not use any tobacco products including cigarettes, chewing tobacco, or electronic cigarettes.  If you are pregnant, do not drink alcohol.  If you are breastfeeding, limit how much and how often you drink alcohol.  Limit alcohol intake to no more than 1 drink per day for nonpregnant women. One drink equals 12 ounces of beer, 5 ounces of wine, or 1 ounces of hard liquor.  Do not use street drugs.  Do not share needles.  Ask your health care provider for help if you need support or information about quitting drugs.  Tell your health care provider if you often feel depressed.  Tell your health care provider if you have ever been abused or do not feel safe at home.   This information is not intended to replace advice given to you by your health care provider. Make sure you discuss any questions you have with your health care provider.   Document Released: 03/30/2011 Document Revised: 10/05/2014 Document Reviewed: 08/16/2013 Elsevier Interactive Patient Education [employer].

## 2015-07-19 LAB — TSH: TSH: 0.902 u[IU]/mL (ref 0.350–4.500)

## 2015-07-23 ENCOUNTER — Ambulatory Visit
Admission: RE | Admit: 2015-07-23 | Discharge: 2015-07-23 | Disposition: A | Discharge status: Home / Self Care | Payer: BLUE CROSS/BLUE SHIELD | Source: Ambulatory Visit | Attending: Physician Assistant | Admitting: Physician Assistant

## 2015-07-23 DIAGNOSIS — R928 Other abnormal and inconclusive findings on diagnostic imaging of breast: Secondary | ICD-10-CM

## 2015-09-29 ENCOUNTER — Other Ambulatory Visit: Payer: Self-pay | Admitting: Physician Assistant

## 2015-09-29 HISTORY — PX: TOE SURGERY: SHX1073

## 2015-12-30 ENCOUNTER — Telehealth: Payer: Self-pay | Admitting: Family Medicine

## 2015-12-30 DIAGNOSIS — E8940 Asymptomatic postprocedural ovarian failure: Secondary | ICD-10-CM

## 2015-12-30 NOTE — Telephone Encounter (Signed)
Patient was seen 07/18/15 and rx sent in for Premarin was the wrong strength, of 0.3 mg. She states it is supposed to be 0.625 mg. I reviewed OV and previous rx's for Premarin all were 0.3 mg. Please advise [facility] [place]

## 2015-12-31 NOTE — Telephone Encounter (Signed)
Can we please call the pharmacy and see what the patient has been getting over the last year.

## 2016-01-01 ENCOUNTER — Telehealth: Payer: Self-pay

## 2016-01-01 NOTE — Telephone Encounter (Signed)
Spoke with pharmacist she states patient has been taking 0.625 mg. The 0.3mg  Rx was cancelled.

## 2016-01-01 NOTE — Telephone Encounter (Signed)
Pt would like for us to call her pharmacy to verify her prescription of the Premarin 0.625mg  please. She is going out of town and would like this done today please.  [facility] [place] [place]

## 2016-01-02 MED ORDER — ESTROGENS CONJUGATED 0.625 MG PO TABS: 0.6250 mg | ORAL_TABLET | Freq: Every day | ORAL | Status: DC

## 2016-01-02 NOTE — Telephone Encounter (Signed)
Done

## 2016-01-02 NOTE — Telephone Encounter (Signed)
See previous message

## 2016-01-02 NOTE — Telephone Encounter (Signed)
Already done

## 2016-01-28 ENCOUNTER — Ambulatory Visit (INDEPENDENT_AMBULATORY_CARE_PROVIDER_SITE_OTHER): Payer: BLUE CROSS/BLUE SHIELD | Admitting: Physician Assistant

## 2016-01-28 VITALS — BP 128/78 | HR 61 | Temp 98.1°F | Resp 18 | Ht 61.0 in | Wt 181.6 lb

## 2016-01-28 DIAGNOSIS — H65191 Other acute nonsuppurative otitis media, right ear: Secondary | ICD-10-CM

## 2016-01-28 MED ORDER — GUAIFENESIN ER 1200 MG PO TB12: 1.0000 | ORAL_TABLET | Freq: Two times a day (BID) | ORAL | Status: DC | PRN

## 2016-01-28 MED ORDER — AZITHROMYCIN 250 MG PO TABS: ORAL_TABLET | ORAL | Status: DC

## 2016-01-28 NOTE — Patient Instructions (Addendum)
     IF you received an x-ray today, you will receive an invoice from [facility]. Please contact [facility] at [phone] with questions or concerns regarding your invoice.   IF you received labwork today, you will receive an invoice from [facility]. Please contact [facility] at [phone] with questions or concerns regarding your invoice.   Our billing staff will not be able to assist you with questions regarding bills from these companies.  You will be contacted with the lab results as soon as they are available. The fastest way to get your results is to activate your My Chart account. Instructions are located on the last page of this paperwork. If you have not heard from us regarding the results in 2 weeks, please contact this office.     Otitis Media, Adult Otitis media is redness, soreness, and inflammation of the middle ear. Otitis media may be caused by allergies or, most commonly, by infection. Often it occurs as a complication of the common cold. SIGNS AND SYMPTOMS Symptoms of otitis media may include:  Earache.  Fever.  Ringing in your ear.  Headache.  Leakage of fluid from the ear. DIAGNOSIS To diagnose otitis media, your health care provider will examine your ear with an otoscope. This is an instrument that allows your health care provider to see into your ear in order to examine your eardrum. Your health care provider also will ask you questions about your symptoms. TREATMENT  Typically, otitis media resolves on its own within 3-5 days. Your health care provider may prescribe medicine to ease your symptoms of pain. If otitis media does not resolve within 5 days or is recurrent, your health care provider may prescribe antibiotic medicines if he or she suspects that a bacterial infection is the cause. HOME CARE INSTRUCTIONS   If you were prescribed an antibiotic medicine, finish it all even if you start to feel  better.  Take medicines only as directed by your health care provider.  Keep all follow-up visits as directed by your health care provider. SEEK MEDICAL CARE IF:  You have otitis media only in one ear, or bleeding from your nose, or both.  You notice a lump on your neck.  You are not getting better in 3-5 days.  You feel worse instead of better. SEEK IMMEDIATE MEDICAL CARE IF:   You have pain that is not controlled with medicine.  You have swelling, redness, or pain around your ear or stiffness in your neck.  You notice that part of your face is paralyzed.  You notice that the bone behind your ear (mastoid) is tender when you touch it. MAKE SURE YOU:   Understand these instructions.  Will watch your condition.  Will get help right away if you are not doing well or get worse.   This information is not intended to replace advice given to you by your health care provider. Make sure you discuss any questions you have with your health care provider.   Document Released: 06/19/2004 Document Revised: 10/05/2014 Document Reviewed: 04/11/2013 Elsevier Interactive Patient Education 2016 Elsevier Inc.  

## 2016-01-28 NOTE — Progress Notes (Signed)
[facility] [address], [place] [address] [phone]  Date:  01/28/2016   Name:  Lacey Wilson   DOB:  [birth year]   MRN:  [id number]  PCP:  Georgian Co, PA-C   Chief Complaint  Patient presents with  . Ear Pain    Rt Ear feel stopped up x 4 days   History of Present Illness:  Lacey Wilson is a 60 y.o. female patient who presents to [facility] for cc of Right ear pain. Right ear is clogged up, and painful for 4 days that has progressively worsened in the last 2. She states that she has difficulty hearing through this ear. She has had no recent upper respiratory symptoms such as nasal congestion, cough, sore throat, or rhinorrhea. She has had a history of ear infections. These were predominantly at the left ear where a tube was placed. She has no left ear pain at this time. There is been no drainage to the ear there is been no fever.    Patient Active Problem List   Diagnosis Date Noted  . H/O hysterectomy with oophorectomy 07/18/2015  . Depression 04/20/2012  . 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 Allergy   . Thyroid disease   . Depression   . Anxiety     Past Surgical History  Procedure Laterality Date  . Appendectomy    . Cholecystectomy    . Abdominal hysterectomy    . Toe surgery  2017    Social History  Substance Use Topics  . Smoking status: Never Smoker   . Smokeless tobacco: None  . Alcohol Use: No    Family History  Problem Relation Age of Onset  . Depression Sister   . Thyroid disease Sister   . Ovarian cancer Mother   . Prostate cancer Father     Allergies  Allergen Reactions  . Amoxicillin     REACTION: rash  . Tetanus Toxoid     REACTION: bad swelling and inflammation    Medication list has been reviewed and updated.  Current Outpatient Prescriptions on File Prior to Visit  Medication Sig Dispense Refill  . buPROPion (WELLBUTRIN XL) 300 MG  24 hr tablet TAKE 1 TABLET BY MOUTH ONCE DAILY 90 tablet 3  . diclofenac (VOLTAREN) 75 MG EC tablet Take 1 tablet (75 mg total) by mouth 2 (two) times daily. 60 tablet 0  . estrogens, conjugated, (PREMARIN) 0.625 MG tablet Take 1 tablet (0.625 mg total) by mouth daily. Take daily for 21 days then do not take for 7 days. 30 tablet 2  . fluticasone (FLONASE) 50 MCG/ACT nasal spray Place 2 sprays into both nostrils daily. 48 g 3  . levothyroxine (SYNTHROID) 137 MCG tablet TAKE 1 TABLET BY MOUTH ONCE DAILY 90 tablet 0  . liothyronine (CYTOMEL) 5 MCG tablet take 1 tablet by mouth once daily 90 tablet 1   No current facility-administered medications on file prior to visit.    ROS ROS otherwise unremarkable unless listed above.  Physical Examination: BP 128/78 mmHg  Pulse 61  Temp(Src) 98.1 F (36.7 C) (Oral)  Resp 18  Ht  (1.549 m)  Wt 181 lb 9.6 oz (82.373 kg)  BMI 34.33 kg/m2  SpO2 100% Ideal Body Weight: Weight in (lb) to have BMI = 25: 132  Physical Exam  Constitutional: She is oriented to person, place, and time. She appears well-developed and well-nourished.  No distress.  HENT:  Head: Normocephalic and atraumatic.  Right Ear: External ear and ear canal normal. No mastoid tenderness.  Left Ear: External ear and ear canal normal. No mastoid tenderness.  Right ear: Tympanic membrane erythematous and retracted with thickening appearance. No perforation detected. Left ear: Scarred tympanic membrane with no erythema or bulging or retraction.  Eyes: Conjunctivae and EOM are normal. Pupils are equal, round, and reactive to light.  Cardiovascular: Normal rate.   Pulmonary/Chest: Effort normal. No respiratory distress.  Neurological: She is alert and oriented to person, place, and time.  Skin: She is not diaphoretic.  Psychiatric: She has a normal mood and affect. Her behavior is normal.     Assessment and Plan: Lacey Cockaynenita G Pennell is a [age] y.o. female who is here today For chief  complaint of right ear pain. This appears to be otitis media. I've advised her to continue the Allegra and to use her Flonase. Advised to return in 48 hours if there is no reduction of her symptoms. I have also given her Mucinex to ensure that fluid buildup is can possibly be reduced.   Subacute nonsuppurative otitis media of right ear - Plan: azithromycin (ZITHROMAX) 250 MG tablet, Guaifenesin (MUCINEX MAXIMUM STRENGTH) 1200 MG TB12  Trena PlattStephanie Ritu Gagliardo, PA-C [facility] 01/28/2016 8:44 AM

## 2016-03-03 ENCOUNTER — Other Ambulatory Visit: Payer: Self-pay | Admitting: Physician Assistant

## 2016-03-06 ENCOUNTER — Other Ambulatory Visit: Payer: Self-pay | Admitting: Physician Assistant

## 2016-03-11 ENCOUNTER — Other Ambulatory Visit: Payer: Self-pay

## 2016-03-11 MED ORDER — ESTROGENS CONJUGATED 0.625 MG PO TABS: ORAL_TABLET | ORAL | Status: DC

## 2016-03-27 ENCOUNTER — Other Ambulatory Visit: Payer: Self-pay | Admitting: Physician Assistant

## 2016-06-25 ENCOUNTER — Other Ambulatory Visit: Payer: Self-pay | Admitting: Physician Assistant

## 2016-06-26 ENCOUNTER — Ambulatory Visit (INDEPENDENT_AMBULATORY_CARE_PROVIDER_SITE_OTHER): Payer: BLUE CROSS/BLUE SHIELD | Admitting: Physician Assistant

## 2016-06-26 VITALS — BP 122/70 | HR 71 | Temp 97.9°F | Resp 18 | Ht 61.0 in | Wt 174.0 lb

## 2016-06-26 DIAGNOSIS — E8940 Asymptomatic postprocedural ovarian failure: Secondary | ICD-10-CM

## 2016-06-26 DIAGNOSIS — F32A Depression, unspecified: Secondary | ICD-10-CM

## 2016-06-26 DIAGNOSIS — N958 Other specified menopausal and perimenopausal disorders: Secondary | ICD-10-CM

## 2016-06-26 DIAGNOSIS — F329 Major depressive disorder, single episode, unspecified: Secondary | ICD-10-CM

## 2016-06-26 DIAGNOSIS — Z23 Encounter for immunization: Secondary | ICD-10-CM

## 2016-06-26 DIAGNOSIS — Z7189 Other specified counseling: Secondary | ICD-10-CM | POA: Diagnosis not present

## 2016-06-26 MED ORDER — BUPROPION HCL ER (XL) 300 MG PO TB24: ORAL_TABLET | ORAL | 3 refills | Status: AC

## 2016-06-26 MED ORDER — ESTROGENS CONJUGATED 0.625 MG PO TABS: ORAL_TABLET | ORAL | 3 refills | Status: DC

## 2016-06-26 NOTE — Patient Instructions (Addendum)
Follow up in three months for medication refill.   Thank you for coming in today. I hope you feel we met your needs.  Feel free to call [facility] if you have any questions or further requests.  Please consider signing up for MyChart if you do not already have it, as this is a great way to communicate with me.  Best,  Whitney McVey, PA-C   IF you received an x-ray today, you will receive an invoice from [facility]. Please contact [facility] at [phone] with questions or concerns regarding your invoice.   IF you received labwork today, you will receive an invoice from [employer]. Please contact [facility] at [phone] with questions or concerns regarding your invoice.   Our billing staff will not be able to assist you with questions regarding bills from these companies.  You will be contacted with the lab results as soon as they are available. The fastest way to get your results is to activate your My Chart account. Instructions are located on the last page of this paperwork. If you have not heard from [place] regarding the results in 2 weeks, please contact this office.

## 2016-06-26 NOTE — Progress Notes (Signed)
Lacey Wilson  MRN: [id number] DOB: 1956-03-04  PCP: Georgian CoAngela McClung, PA-C  Subjective:  Pt is a 60 year old female, history of hypothyroidism, depression and surgical menopause, presents to clinic for medication refill. Feeling well today, no complaints.   Depression - Wellbutrin 300 MG a day. [facility]. Well controlled, felling well despite many recent life stressors: lost her job, grandchild is sick and in the hospital and her father has Alzheimer's, she is trying to find a home for her father.  She is looking for a job doing "Anything from care-giving to clerical" Reduces stress by working in her yard with her flowers, exercising - treadmill and weight lifting. Sleeping well.   Surgical menopause -  Dr. Marice PotterMarkazey Hysterectomy at 60 years old due to precancerous ovarian cysts. Takes Premarin 0.6 mg tablet.  [place].   Review of Systems  Constitutional: Negative.   Respiratory: Negative.   Cardiovascular: Negative.   Endocrine: Negative.   Genitourinary: Negative for menstrual problem, vaginal bleeding, vaginal discharge and vaginal pain.  Psychiatric/Behavioral: Negative for dysphoric mood, sleep disturbance and suicidal ideas. The patient is not nervous/anxious.     Patient Active Problem List   Diagnosis Date Noted  . H/O hysterectomy with oophorectomy 07/18/2015  . Depression 04/20/2012  . Hypothyroidism due to acquired atrophy of thyroid 01/28/2012    Current Outpatient Prescriptions on File Prior to Visit  Medication Sig Dispense Refill  . buPROPion (WELLBUTRIN XL) 300 MG 24 hr tablet TAKE 1 TABLET BY MOUTH ONCE DAILY 90 tablet 3  . diclofenac (VOLTAREN) 75 MG EC tablet Take 1 tablet (75 mg total) by mouth 2 (two) times daily. 60 tablet 0  . estrogens, conjugated, (PREMARIN) 0.625 MG tablet TAKE 1 TABLET BY MOUTH EVERY DAY FOR 21 DAYS, THEN DO NOT TAKE FOR 7 DAYS 30 tablet 3  . fluticasone (FLONASE) 50  MCG/ACT nasal spray Place 2 sprays into both nostrils daily. 48 g 3  . levothyroxine (SYNTHROID) 137 MCG tablet TAKE 1 TABLET BY MOUTH ONCE DAILY 90 tablet 0  . liothyronine (CYTOMEL) 5 MCG tablet take 1 tablet by mouth once daily 30 tablet 0   No current facility-administered medications on file prior to visit.     Allergies  Allergen Reactions  . Amoxicillin     REACTION: rash  . Tetanus Toxoid     REACTION: bad swelling and inflammation    Objective:  BP 122/70 (BP Location: Right Arm, Patient Position: Sitting, Cuff Size: Small)   Pulse 71   Temp 97.9 F (36.6 C) (Oral)   Resp 18   Ht 5\' 1"  (1.549 m)   Wt 174 lb (78.9 kg)   SpO2 98%   BMI 32.88 kg/m   Physical Exam  Constitutional: She is oriented to person, place, and time and well-developed, well-nourished, and in no distress. No distress.  Cardiovascular: Normal rate, regular rhythm and normal heart sounds.   Pulmonary/Chest: Effort normal. No respiratory distress.  Neurological: She is alert and oriented to person, place, and time. GCS score is 15.  Skin: Skin is warm and dry.  Psychiatric: Mood, memory, affect and judgment normal.  Vitals reviewed.   Assessment and Plan :  1. Encounter for medication counseling 2. Depression - buPROPion (WELLBUTRIN XL) 300 MG 24 hr tablet; TAKE 1 TABLET BY MOUTH ONCE DAILY  Dispense: 90 tablet; Refill: 3 - Supportive care: discussed with patient importance of techniques for reducing stress. Encouraged patient to  exercise regularly, eat a healthy diet and use breathing techniques.   3. Surgical menopause - History of hysterectomy with oophorectomy - estrogens, conjugated, (PREMARIN) 0.625 MG tablet; TAKE 1 TABLET BY MOUTH EVERY DAY FOR 21 DAYS, THEN DO NOT TAKE FOR 7 DAYS  Dispense: 30 tablet; Refill: 3  4. Need for prophylactic vaccination and inoculation against influenza - Flu Vaccine QUAD 36+ mos IM   Marco Collie, PA-C  [facility] 06/26/2016 3:19 PM

## 2016-06-26 NOTE — Telephone Encounter (Signed)
You saw pt today.  No mention of refill on this med.   Please advise

## 2016-07-02 NOTE — Telephone Encounter (Signed)
Please let this patient know her medication is at her pharmacy. She needs to have her TSH drawn at next visit.

## 2016-08-25 ENCOUNTER — Telehealth: Payer: Self-pay

## 2016-08-25 DIAGNOSIS — Z90710 Acquired absence of both cervix and uterus: Secondary | ICD-10-CM

## 2016-08-25 NOTE — Telephone Encounter (Signed)
Pt is taking premarin and she no longer has insurance and would like something called in that is cheaper  Best number [phone]

## 2016-08-26 ENCOUNTER — Telehealth: Payer: Self-pay

## 2016-08-26 NOTE — Telephone Encounter (Signed)
Pt is checking on status of her message from yesterday about her premarin and how expensive it is

## 2016-08-28 MED ORDER — NORGESTIMATE-ETH ESTRADIOL 0.25-35 MG-MCG PO TABS: 1.0000 | ORAL_TABLET | Freq: Every day | ORAL | 4 refills | Status: AC

## 2016-08-28 NOTE — Telephone Encounter (Signed)
I spoke with pharmacist and she said estradiol is the cheaper treatment Please advise

## 2016-08-28 NOTE — Telephone Encounter (Signed)
Please call patient and let her know this has been filled. Thanks you!

## 2016-08-29 NOTE — Telephone Encounter (Signed)
Called to advise.  

## 2017-04-15 ENCOUNTER — Other Ambulatory Visit: Payer: Self-pay | Admitting: Physician Assistant

## 2017-04-15 DIAGNOSIS — Z1231 Encounter for screening mammogram for malignant neoplasm of breast: Secondary | ICD-10-CM

## 2017-05-03 IMAGING — MG MM DIAG BREAST TOMO UNI LEFT
4 series · 4 of 12 positions shown · non-contrast
Comparison: Previous exam(s).

CLINICAL DATA: Left breast focal asymmetry seen on most recent
screening mammography.

EXAM:
DIGITAL DIAGNOSTIC LEFT MAMMOGRAM WITH 3D TOMOSYNTHESIS AND CAD

[L MLO]
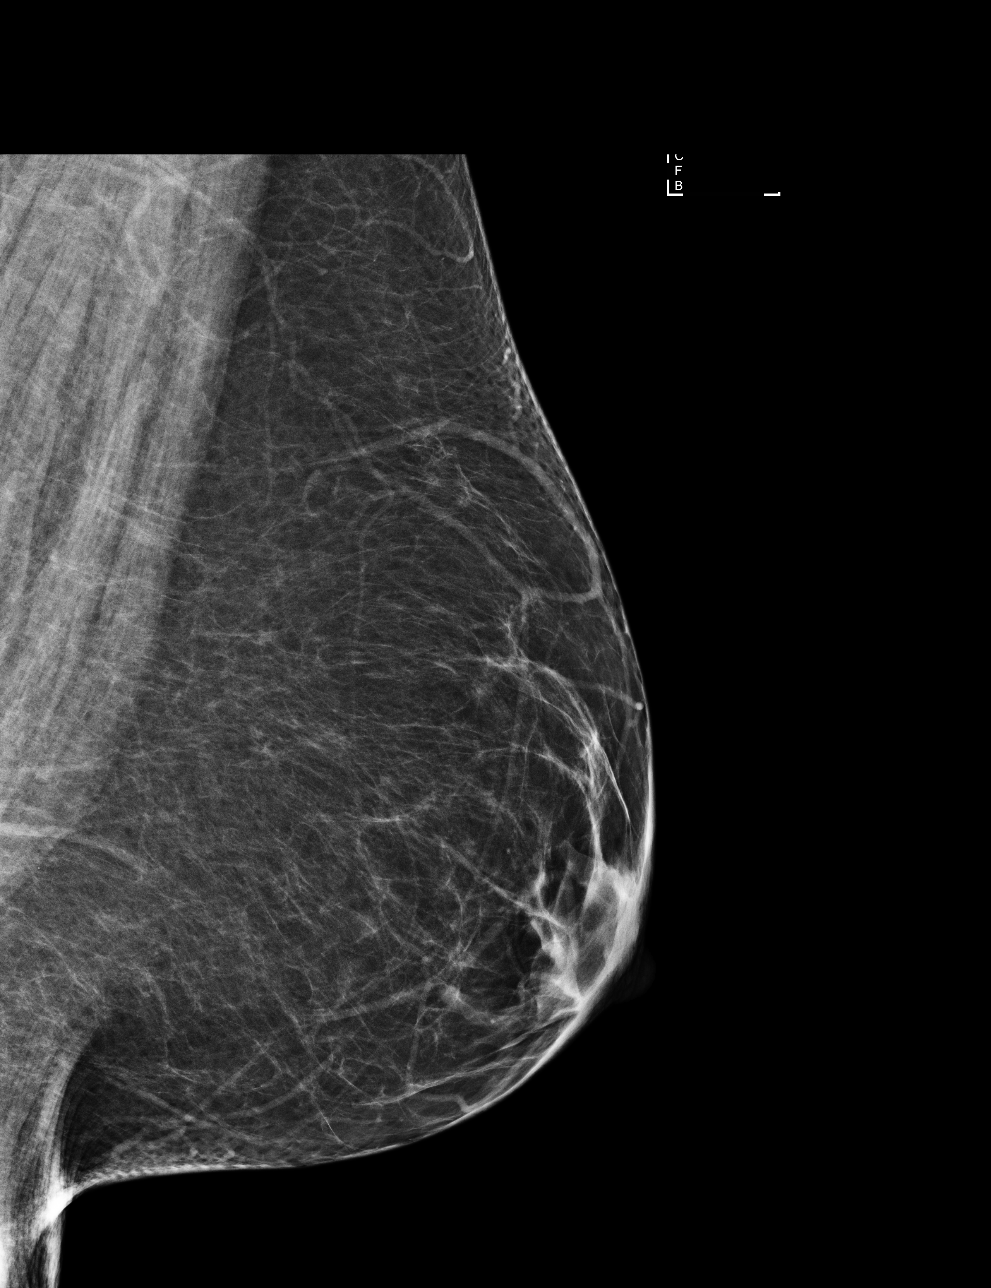

[L CC]
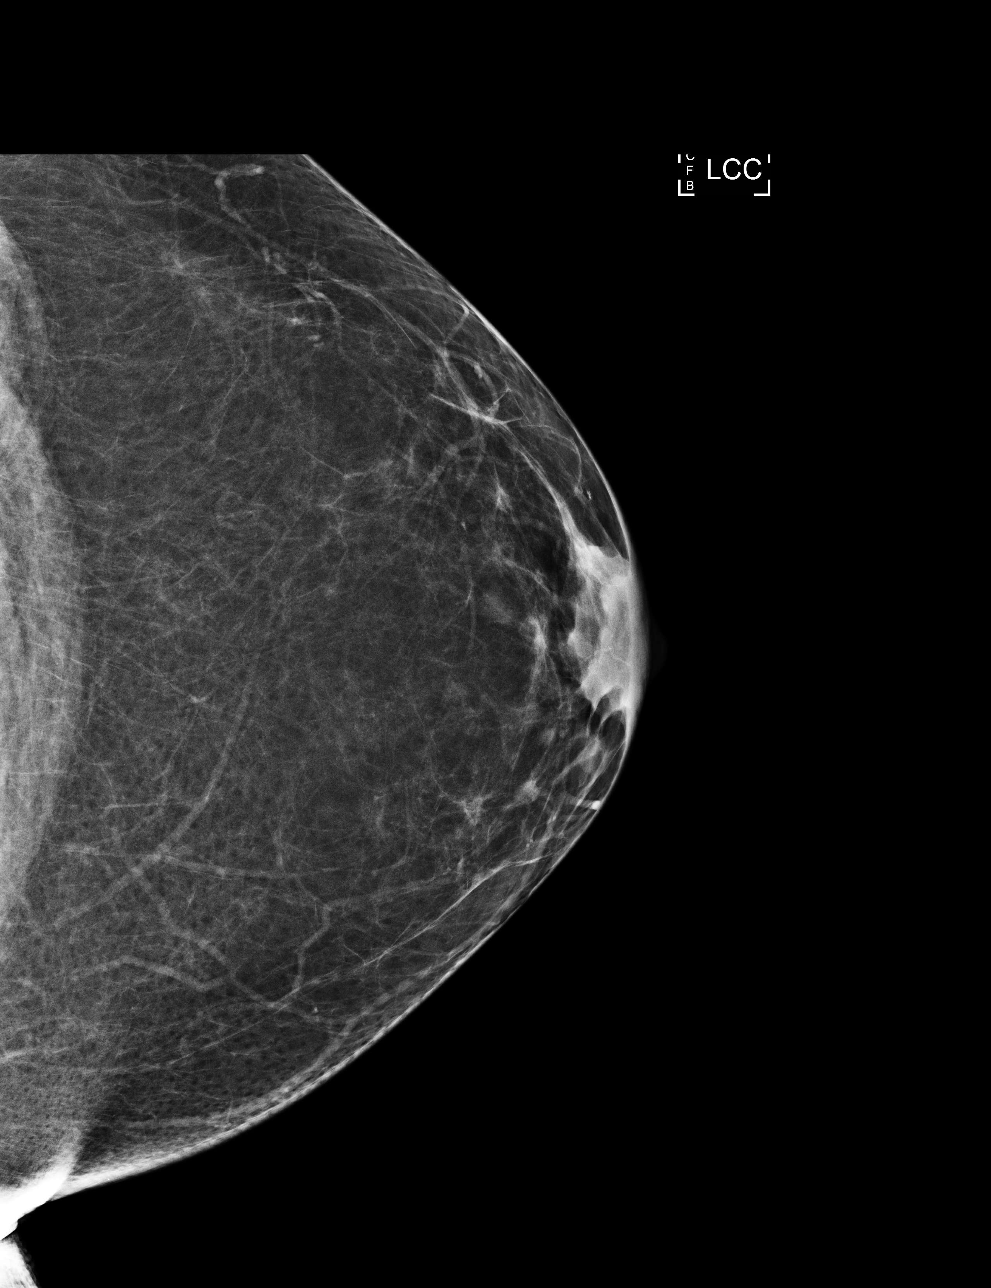

[L MLO tomo · tomo slice 34/67.0]
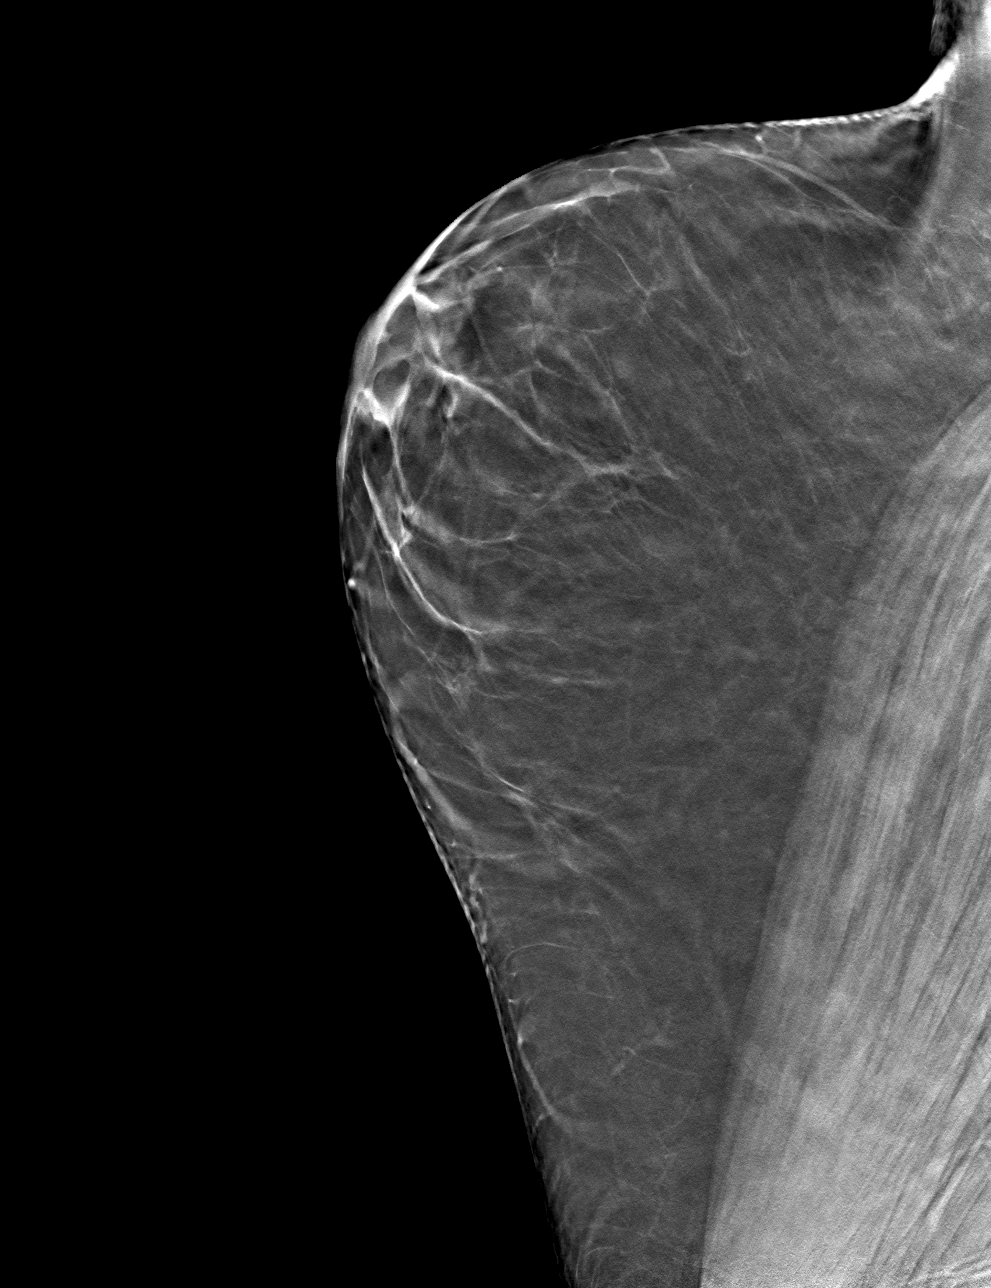

[L CC tomo · tomo slice 33/64.0]
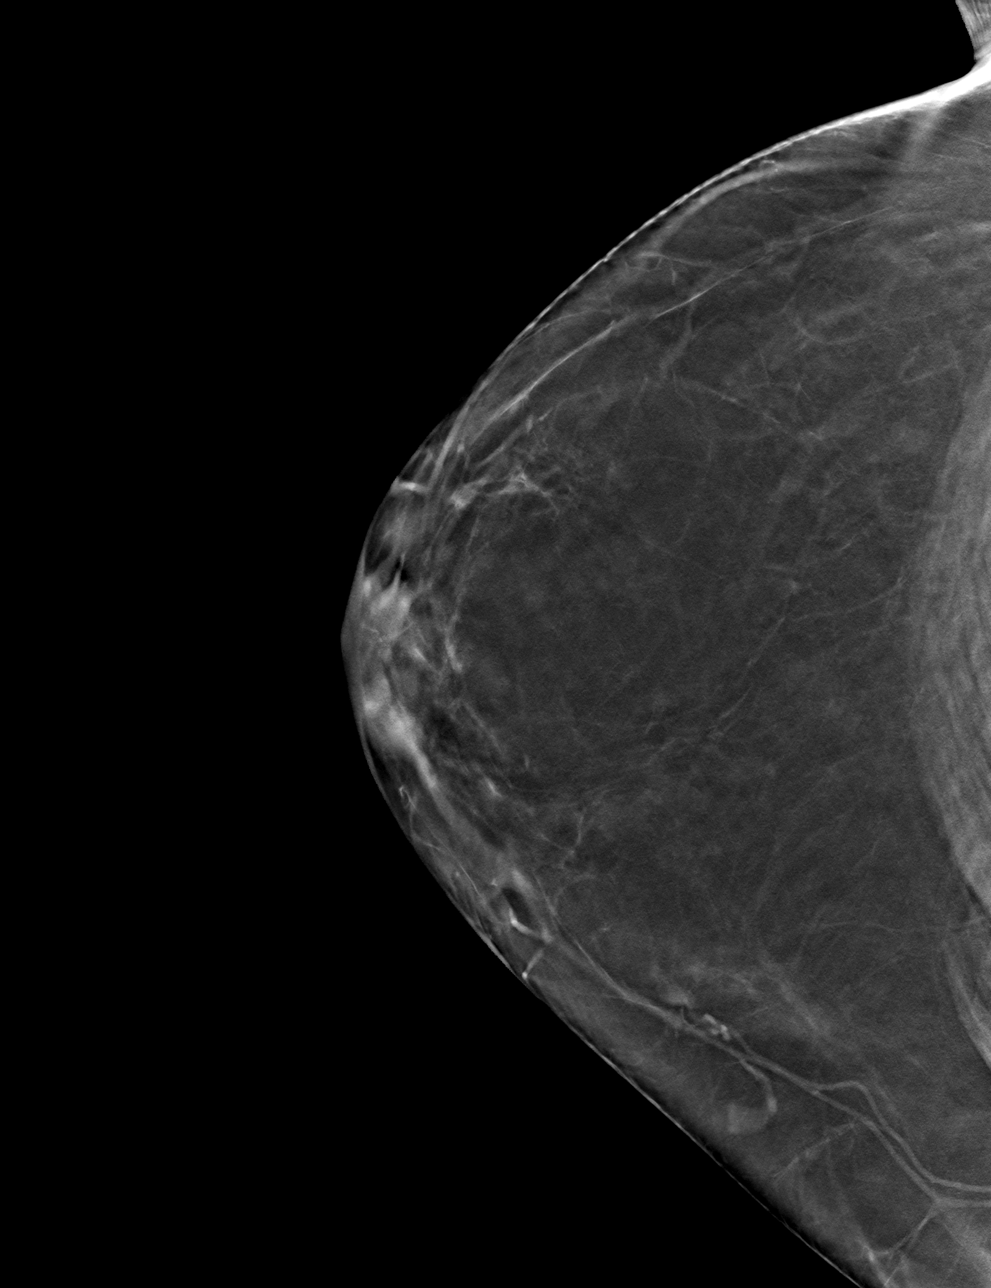

[4 of 12 positions shown; findings below may reference images not displayed]

ACR Breast Density Category b: There are scattered areas of
fibroglandular density.
FINDINGS: Additional mammographic views of the left breast demonstrate no
suspicious masses, areas of nonsurgical architectural distortion or
microcalcifications. The previously seen focal asymmetry in the
lower central left breast effaces to glandular tissue.

Mammographic images were processed with CAD.
IMPRESSION: No mammographic evidence of malignancy in the left breast.

RECOMMENDATION:
Screening mammogram in one year.(Code:[id number])

I have discussed the findings and recommendations with the patient.
Results were also provided in writing at the conclusion of the
visit. If applicable, a reminder letter will be sent to the patient
regarding the next appointment.

BI-RADS CATEGORY  1: Negative.

## 2017-05-05 ENCOUNTER — Ambulatory Visit
Admission: RE | Admit: 2017-05-05 | Discharge: 2017-05-05 | Disposition: A | Discharge status: Home / Self Care | Payer: BLUE CROSS/BLUE SHIELD | Source: Ambulatory Visit | Attending: Physician Assistant | Admitting: Physician Assistant

## 2017-05-05 DIAGNOSIS — Z1231 Encounter for screening mammogram for malignant neoplasm of breast: Secondary | ICD-10-CM

## 2017-05-06 ENCOUNTER — Other Ambulatory Visit: Payer: Self-pay | Admitting: Physician Assistant

## 2017-05-06 DIAGNOSIS — Z1231 Encounter for screening mammogram for malignant neoplasm of breast: Secondary | ICD-10-CM

## 2017-05-06 DIAGNOSIS — E8940 Asymptomatic postprocedural ovarian failure: Secondary | ICD-10-CM

## 2017-05-06 NOTE — Telephone Encounter (Signed)
mychart message sent to pt about making an apt for more refills °

## 2017-05-06 NOTE — Telephone Encounter (Signed)
Rx Premarin refilled x 30 days Sent to schedule pool for appointment

## 2017-06-02 ENCOUNTER — Ambulatory Visit: Payer: BLUE CROSS/BLUE SHIELD | Admitting: Physician Assistant

## 2017-06-09 ENCOUNTER — Ambulatory Visit: Payer: BLUE CROSS/BLUE SHIELD | Admitting: Physician Assistant

## 2018-01-30 ENCOUNTER — Encounter: Payer: Self-pay | Admitting: Gastroenterology
